# Patient Record
Sex: Female | Born: 1990 | Race: Black or African American | Hispanic: No | Marital: Single | Smoking: Never smoker
Health system: Southern US, Community
[De-identification: ages and names within clinical notes are randomized; demographics above are authoritative.]

## PROBLEM LIST (undated history)

## (undated) DIAGNOSIS — Z789 Other specified health status: Secondary | ICD-10-CM

## (undated) DIAGNOSIS — Z8619 Personal history of other infectious and parasitic diseases: Secondary | ICD-10-CM

## (undated) HISTORY — DX: Other specified health status: Z78.9

## (undated) HISTORY — DX: Personal history of other infectious and parasitic diseases: Z86.19

## (undated) HISTORY — PX: NO PAST SURGERIES: SHX2092

---
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
## 2010-09-23 NOTE — Op Note (Signed)
Tasha Murray, Tasha Murray               ACCOUNT NO.:  [id number]   MEDICAL RECORD NO.:  [id number]          PATIENT TYPE:  AMB   LOCATION:  SDS                          FACILITY:  [facility]   PHYSICIAN:  Newman Pies, MD            DATE OF BIRTH:  1991/02/22   DATE OF PROCEDURE:  DATE OF DISCHARGE:  05/09/2008                               OPERATIVE REPORT   SURGEON:  Newman Pies, MD   PREOPERATIVE DIAGNOSES:  1. Adenotonsillar hypertrophy.  2. Chronic tonsillitis/pharyngitis.   POSTOPERATIVE DIAGNOSES:  1. Adenotonsillar hypertrophy.  2. Chronic tonsillitis/pharyngitis.   PROCEDURE PERFORMED:  Adenotonsillectomy.   ANESTHESIA:  General endotracheal tube anesthesia.   COMPLICATIONS:  None.   ESTIMATED BLOOD LOSS:  Minimal.   INDICATIONS FOR PROCEDURE:  Zi Sek is a 20 year old female with a  history of chronic sore throat, chronic tonsillitis/pharyngitis.  On  examination, she was noted to have significant adenotonsillar  hypertrophy.  Based on the above findings, the decision was made for the  patient to undergo adenotonsillectomy.  The risks, benefits,  alternatives, and details of the procedure were discussed with the  patient and her mother.  Questions were invited and answered.  Informed  consent was obtained.   DESCRIPTION:  The patient was taken to the operating room and placed  supine on the operating table.  General endotracheal tube anesthesia was  administered by the anesthesiologist.  Preop IV antibiotic and Decadron  were given.  The patient was then positioned and prepped and draped in  the standard fashion for adenotonsillectomy.  A Crowe-Davis mouthgag was  inserted into the oral cavity for exposure.  Tonsils 2+ were noted  bilaterally.  No submucous cleft or bifidity was noted.  Indirect mirror  examination of the nasopharynx revealed significant adenoid hypertrophy.  The adenoid was resected with electric adenotome.  The right tonsil was  grasped with a straight  Allis clamp and retracted medially.  It was  resected free from the underlying pharyngeal constrictor muscles with  the Coblation device.  The same procedure was repeated on the left side  without exception.  Hemostasis was achieved with the Coblation device.  The surgical sites were copiously irrigated.  An OG tube was passed to  evacuate the stomach content.  The Crowe-Davis mouthgag was removed.  The care of the patient was turned over to the anesthesiologist.  The  patient was awakened from anesthesia without difficulty.  She was  extubated and transferred to the recovery room in good condition.   OPERATIVE FINDINGS:  Adenotonsillar hypertrophy.   SPECIMENS REMOVED:  Adenoids and tonsils.   FOLLOWUP CARE:  The patient will be discharged home when she is awake,  alert, and tolerating p.o.  She will be placed on Roxicet 5-10 mL p.o.  q.4-6 h. p.r.n. pain and amoxicillin 800 mg p.o. b.i.d. for 7 days.      Newman Pies, MD  Electronically Signed     ST/MEDQ  D:  05/09/2008  T:  05/09/2008  Job:  [id number]

## 2010-09-23 NOTE — Op Note (Signed)
NAMEMARYLYNN, Tasha Murray               ACCOUNT NO.:  [id number]   MEDICAL RECORD NO.:  [id number]          PATIENT TYPE:  AMB   LOCATION:  SDS                          FACILITY:  [facility]   PHYSICIAN:  Newman Pies, MD            DATE OF BIRTH:  [birth year]   DATE OF PROCEDURE:  03/09/2008  DATE OF DISCHARGE:  03/09/2008                               OPERATIVE REPORT   PREOPERATIVE DIAGNOSES:  1. Chronic cough.  2. Throat pain.  3. Possible left piriform sinus mass.   POSTOPERATIVE DIAGNOSES:  1. Chronic cough.  2. Throat pain.  3. No piriform sinus mass is noted.   PROCEDURE PERFORMED:  Diagnostic direct laryngoscopy.   SURGEON:  Newman Pies, MD   ANESTHESIA:  General endotracheal tube anesthesia.   COMPLICATIONS:  None.   ESTIMATED BLOOD LOSS:  None.   INDICATIONS FOR PROCEDURE:  The patient is a 20 year old African  American female with a history of frequent coughing spells and throat  discomfort.  On examination, she was noted to have minimal posterior  laryngeal edema.  However, a CT scan shows a possible left piriform  sinus mass.  Based on that finding, the decision was made for the  patient to undergo direct laryngoscopy under anesthesia.  The risks,  benefits, alternatives, and details of the procedure were discussed with  the patient and her mother.  Questions were invited and answered.  Informed consent was obtained.   DESCRIPTION OF PROCEDURE:  The patient was taken to the operating room  and placed supine on the operating table.  General endotracheal tube  anesthesia was administered by the anesthesiologist.  With the patient  relaxed and sedated, a Dedo laryngoscope was inserted via the oral  cavity to expose the pharynx and the larynx.  Tonsils 2+ were noted  bilaterally.  The pharyngeal mucosa was normal.  The vallecula, piriform  sinuses, and postcricoid area were all normal.  No piriform sinus mass  was noted.  Both vocal cords were symmetric and normal.  No  suspicious  laryngeal or pharyngeal lesion was noted.  That concluded procedure for  the patient.  The care of the patient was turned over to the  anesthesiologist.  The patient was awakened from anesthesia without  difficulty.  She was extubated and transferred to the recovery room in  good condition.   OPERATIVE FINDINGS:  Tonsils 2+ were noted bilaterally.  No piriform  sinus mass was noted.  The pharyngeal and laryngeal examinations were  unremarkable.   SPECIMENS REMOVED:  None.   FOLLOWUP CARE:  The patient will be discharged home once she is awake  and alert.      Newman Pies, MD  Electronically Signed     ST/MEDQ  D:  03/09/2008  T:  03/09/2008  Job:  [id number]

## 2011-02-10 LAB — CBC
HCT: 41.7
Hemoglobin: 13.9
MCHC: 33.2
MCV: 87.2
Platelets: 245
RBC: 4.78
RDW: 14.6
WBC: 9.7

## 2011-02-13 LAB — CBC
HCT: 41.2 % (ref 36.0–49.0)
Hemoglobin: 13.6 g/dL (ref 12.0–16.0)
MCHC: 33.2 g/dL (ref 31.0–37.0)
MCV: 85 fL (ref 78.0–98.0)
Platelets: 271 10*3/uL (ref 150–400)
RBC: 4.84 MIL/uL (ref 3.80–5.70)
RDW: 14.8 % (ref 11.4–15.5)
WBC: 11.1 10*3/uL (ref 4.5–13.5)

## 2011-02-13 LAB — HCG, SERUM, QUALITATIVE: Preg, Serum: NEGATIVE

## 2013-04-26 ENCOUNTER — Other Ambulatory Visit: Payer: Self-pay | Admitting: Obstetrics & Gynecology

## 2013-04-26 DIAGNOSIS — O3680X0 Pregnancy with inconclusive fetal viability, not applicable or unspecified: Secondary | ICD-10-CM

## 2013-05-09 ENCOUNTER — Other Ambulatory Visit: Payer: Self-pay | Admitting: Obstetrics & Gynecology

## 2013-05-09 ENCOUNTER — Encounter: Payer: Self-pay | Admitting: Women's Health

## 2013-05-09 ENCOUNTER — Encounter (INDEPENDENT_AMBULATORY_CARE_PROVIDER_SITE_OTHER): Payer: Self-pay

## 2013-05-09 ENCOUNTER — Ambulatory Visit (INDEPENDENT_AMBULATORY_CARE_PROVIDER_SITE_OTHER): Payer: Medicaid Other

## 2013-05-09 DIAGNOSIS — O26849 Uterine size-date discrepancy, unspecified trimester: Secondary | ICD-10-CM

## 2013-05-09 DIAGNOSIS — O3680X0 Pregnancy with inconclusive fetal viability, not applicable or unspecified: Secondary | ICD-10-CM

## 2013-05-09 NOTE — Progress Notes (Signed)
U/S-transabdominal u/s performed, single IUP with +FCA noted, FHR-158 bpm, meas c/w 16+1wks, EDD 10/23/2013, cx appears closed (3.1cm), bilateral adnexa wnl, anterior Gr 0 placenta

## 2013-05-16 ENCOUNTER — Encounter: Payer: Self-pay | Admitting: Advanced Practice Midwife

## 2013-05-16 ENCOUNTER — Ambulatory Visit (INDEPENDENT_AMBULATORY_CARE_PROVIDER_SITE_OTHER): Payer: Medicaid Other | Admitting: Advanced Practice Midwife

## 2013-05-16 ENCOUNTER — Other Ambulatory Visit: Payer: Self-pay | Admitting: Advanced Practice Midwife

## 2013-05-16 ENCOUNTER — Encounter: Payer: Medicaid Other | Admitting: Women's Health

## 2013-05-16 ENCOUNTER — Other Ambulatory Visit (HOSPITAL_COMMUNITY)
Admission: RE | Admit: 2013-05-16 | Discharge: 2013-05-16 | Disposition: A | Discharge status: Home / Self Care | Payer: Medicaid Other | Source: Ambulatory Visit | Attending: Advanced Practice Midwife | Admitting: Advanced Practice Midwife

## 2013-05-16 VITALS — BP 112/70 | Ht 60.0 in | Wt 306.0 lb

## 2013-05-16 DIAGNOSIS — Z01419 Encounter for gynecological examination (general) (routine) without abnormal findings: Secondary | ICD-10-CM

## 2013-05-16 DIAGNOSIS — Z1389 Encounter for screening for other disorder: Secondary | ICD-10-CM

## 2013-05-16 DIAGNOSIS — Z3400 Encounter for supervision of normal first pregnancy, unspecified trimester: Secondary | ICD-10-CM

## 2013-05-16 DIAGNOSIS — Z3490 Encounter for supervision of normal pregnancy, unspecified, unspecified trimester: Secondary | ICD-10-CM

## 2013-05-16 DIAGNOSIS — R87612 Low grade squamous intraepithelial lesion on cytologic smear of cervix (LGSIL): Secondary | ICD-10-CM

## 2013-05-16 DIAGNOSIS — E669 Obesity, unspecified: Secondary | ICD-10-CM

## 2013-05-16 DIAGNOSIS — O99210 Obesity complicating pregnancy, unspecified trimester: Secondary | ICD-10-CM

## 2013-05-16 DIAGNOSIS — Z113 Encounter for screening for infections with a predominantly sexual mode of transmission: Secondary | ICD-10-CM | POA: Insufficient documentation

## 2013-05-16 DIAGNOSIS — Z124 Encounter for screening for malignant neoplasm of cervix: Secondary | ICD-10-CM | POA: Insufficient documentation

## 2013-05-16 DIAGNOSIS — O98212 Gonorrhea complicating pregnancy, second trimester: Secondary | ICD-10-CM

## 2013-05-16 DIAGNOSIS — O0930 Supervision of pregnancy with insufficient antenatal care, unspecified trimester: Secondary | ICD-10-CM

## 2013-05-16 DIAGNOSIS — Z3482 Encounter for supervision of other normal pregnancy, second trimester: Secondary | ICD-10-CM

## 2013-05-16 DIAGNOSIS — O0932 Supervision of pregnancy with insufficient antenatal care, second trimester: Secondary | ICD-10-CM

## 2013-05-16 DIAGNOSIS — Z331 Pregnant state, incidental: Secondary | ICD-10-CM

## 2013-05-16 LAB — CBC
HEMATOCRIT: 39.7 % (ref 36.0–46.0)
HEMOGLOBIN: 13.1 g/dL (ref 12.0–15.0)
MCH: 27.3 pg (ref 26.0–34.0)
MCHC: 33 g/dL (ref 30.0–36.0)
MCV: 82.7 fL (ref 78.0–100.0)
Platelets: 299 10*3/uL (ref 150–400)
RBC: 4.8 MIL/uL (ref 3.87–5.11)
RDW: 18.6 % — ABNORMAL HIGH (ref 11.5–15.5)
WBC: 13.3 10*3/uL — ABNORMAL HIGH (ref 4.0–10.5)

## 2013-05-16 LAB — POCT URINALYSIS DIPSTICK
Glucose, UA: NEGATIVE
Leukocytes, UA: NEGATIVE
Nitrite, UA: NEGATIVE
RBC UA: NEGATIVE

## 2013-05-16 NOTE — Progress Notes (Signed)
  Subjective:    Tasha Murray is a G1P0 5575w1d being seen today for her first obstetrical visit.  Her obstetrical history is significant for late pnc @ 16 weeks.  Pregnancy history fully reviewed.  Patient reports no complaints.  FOund out she was pregnant a few weeks ago.Ceasar Mons.  Filed Vitals:   05/16/13 1335  BP: 112/70  Weight: 306 lb (138.801 kg)    HISTORY: OB History  Gravida Para Term Preterm AB SAB TAB Ectopic Multiple Living  1             # Outcome Date GA Lbr Len/2nd Weight Sex Delivery Anes PTL Lv  1 CUR              Past Medical History  Diagnosis Date  . Medical history non-contributory    Past Surgical History  Procedure Laterality Date  . No past surgeries     Family History  Problem Relation Age of Onset  . Heart disease Mother   . Arthritis Mother   . Asthma Mother   . Heart disease Maternal Grandmother   . Diabetes Maternal Grandmother      Exam       Pelvic Exam:    Perineum: Normal Perineum   Vulva: normal   Vagina:  normal mucosa, normal discharge, no palpable nodules   Uterus    + FHR     Cervix: normal   Adnexa: Not palpable   Urinary:  urethral meatus normal    System: Breast:  normal appearance, no masses or tenderness   Skin: normal coloration and turgor, no rashes    Neurologic: oriented, normal, normal mood   Extremities: normal strength, tone, and muscle mass   HEENT PERRLA   Mouth/Teeth mucous membranes moist, pharynx normal without lesions   Neck supple and no masses   Cardiovascular: regular rate and rhythm   Respiratory:  appears well, vitals normal, no respiratory distress, acyanotic, normal RR   Abdomen: soft, non-tender; bowel sounds normal; no masses,  no organomegaly          Assessment:    Pregnancy: G1P0 Patient Active Problem List   Diagnosis Date Noted  . Pregnant 05/16/2013  . Late prenatal care complicating pregnancy in second trimester 05/16/2013        Plan:     Initial labs drawn. Prenatal  vitamins. Problem list reviewed and updated. Genetic Screening discussed Quad Screen: requested.  Ultrasound discussed; fetal survey: requested.  Follow up in 2 weeks.  CRESENZO-DISHMAN,Rhayne Chatwin 05/16/2013

## 2013-05-17 LAB — AFP, QUAD SCREEN
AFP: 20.1 IU/mL
Curr Gest Age: 17.1 wks.days
HCG, Total: 12836 m[IU]/mL
INH: 191.7 pg/mL
Interpretation-AFP: NEGATIVE
MOM FOR HCG: 0.93
MOM FOR INH: 1.62
MoM for AFP: 0.92
Open Spina bifida: NEGATIVE
Tri 18 Scr Risk Est: NEGATIVE
uE3 Mom: 2.07
uE3 Value: 1 ng/mL

## 2013-05-17 LAB — URINALYSIS, ROUTINE W REFLEX MICROSCOPIC
BILIRUBIN URINE: NEGATIVE
GLUCOSE, UA: NEGATIVE mg/dL
Hgb urine dipstick: NEGATIVE
Ketones, ur: 15 mg/dL — AB
Nitrite: NEGATIVE
PH: 7.5 (ref 5.0–8.0)
Protein, ur: NEGATIVE mg/dL
Specific Gravity, Urine: 1.014 (ref 1.005–1.030)
Urobilinogen, UA: 1 mg/dL (ref 0.0–1.0)

## 2013-05-17 LAB — CYSTIC FIBROSIS DIAGNOSTIC STUDY

## 2013-05-17 LAB — RUBELLA SCREEN: Rubella: 6.01 Index — ABNORMAL HIGH (ref <0.90)

## 2013-05-17 LAB — VARICELLA ZOSTER ANTIBODY, IGG: Varicella IgG: >4000 Index — ABNORMAL HIGH (ref <135.00)

## 2013-05-17 LAB — DRUG SCREEN, URINE, NO CONFIRMATION
AMPHETAMINE SCRN UR: NEGATIVE
BENZODIAZEPINES.: NEGATIVE
Barbiturate Quant, Ur: NEGATIVE
Cocaine Metabolites: NEGATIVE
Creatinine,U: 165.4 mg/dL
Marijuana Metabolite: NEGATIVE
Methadone: NEGATIVE
OPIATE SCREEN, URINE: NEGATIVE
PHENCYCLIDINE (PCP): NEGATIVE
Propoxyphene: NEGATIVE

## 2013-05-17 LAB — HEPATITIS B SURFACE ANTIGEN: HEP B S AG: NEGATIVE

## 2013-05-17 LAB — URINALYSIS, MICROSCOPIC ONLY
Bacteria, UA: NONE SEEN
CRYSTALS: NONE SEEN
Casts: NONE SEEN

## 2013-05-17 LAB — ABO AND RH: Rh Type: POSITIVE

## 2013-05-17 LAB — SICKLE CELL SCREEN: Sickle Cell Screen: NEGATIVE

## 2013-05-17 LAB — RPR

## 2013-05-17 LAB — OXYCODONE SCREEN, UA, RFLX CONFIRM: Oxycodone Screen, Ur: NEGATIVE ng/mL

## 2013-05-17 LAB — HIV ANTIBODY (ROUTINE TESTING W REFLEX): HIV: NONREACTIVE

## 2013-05-18 LAB — URINE CULTURE
COLONY COUNT: NO GROWTH
Organism ID, Bacteria: NO GROWTH

## 2013-05-23 ENCOUNTER — Ambulatory Visit (INDEPENDENT_AMBULATORY_CARE_PROVIDER_SITE_OTHER): Payer: Medicaid Other | Admitting: Adult Health

## 2013-05-23 VITALS — BP 120/76 | Ht 60.0 in | Wt 306.0 lb

## 2013-05-23 DIAGNOSIS — R87612 Low grade squamous intraepithelial lesion on cytologic smear of cervix (LGSIL): Secondary | ICD-10-CM | POA: Insufficient documentation

## 2013-05-23 DIAGNOSIS — O98212 Gonorrhea complicating pregnancy, second trimester: Secondary | ICD-10-CM | POA: Insufficient documentation

## 2013-05-23 DIAGNOSIS — Z3490 Encounter for supervision of normal pregnancy, unspecified, unspecified trimester: Secondary | ICD-10-CM

## 2013-05-23 DIAGNOSIS — O98219 Gonorrhea complicating pregnancy, unspecified trimester: Secondary | ICD-10-CM

## 2013-05-23 DIAGNOSIS — A549 Gonococcal infection, unspecified: Secondary | ICD-10-CM

## 2013-05-23 DIAGNOSIS — O0932 Supervision of pregnancy with insufficient antenatal care, second trimester: Secondary | ICD-10-CM

## 2013-05-23 MED ORDER — AZITHROMYCIN 500 MG PO TABS: 1000.0000 mg | ORAL_TABLET | Freq: Once | ORAL | Status: DC

## 2013-05-23 MED ORDER — CEFTRIAXONE SODIUM 1 G IJ SOLR
250.0000 mg | Freq: Once | INTRAMUSCULAR | Status: AC
  Administered 2013-05-23: 250 mg via INTRAMUSCULAR

## 2013-05-23 NOTE — Addendum Note (Signed)
Addended by: Jacklyn ShellRESENZO-DISHMON, Any Mcneice on: 05/23/2013 10:37 AM   Modules accepted: Orders

## 2013-05-23 NOTE — Progress Notes (Signed)
Patient ID: Tasha FickKiri S Rasmus, female   DOB: 05-Jan-1991, 23 y.o.   MRN: [id number] Pt here for rocephin injection. Pt denies any problems or concerns at this time.

## 2013-05-23 NOTE — Progress Notes (Signed)
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  Pt called and given results.  Will schedule appt for Rocephin 250mg  IM and azithromycin 1gm PO sent to pharmacy.  Colpo also scheduled.,  Pt knows not to have intercourse until POC in about 3 weeks. CRESENZO-DISHMAN,Adalynne Steffensmeier 10:36 AM

## 2013-05-31 ENCOUNTER — Encounter: Payer: Self-pay | Admitting: Women's Health

## 2013-05-31 ENCOUNTER — Ambulatory Visit (INDEPENDENT_AMBULATORY_CARE_PROVIDER_SITE_OTHER): Payer: Medicaid Other

## 2013-05-31 ENCOUNTER — Other Ambulatory Visit: Payer: Self-pay | Admitting: Advanced Practice Midwife

## 2013-05-31 ENCOUNTER — Ambulatory Visit (INDEPENDENT_AMBULATORY_CARE_PROVIDER_SITE_OTHER): Payer: Medicaid Other | Admitting: Women's Health

## 2013-05-31 VITALS — BP 102/64 | Wt 307.0 lb

## 2013-05-31 DIAGNOSIS — Z1389 Encounter for screening for other disorder: Secondary | ICD-10-CM

## 2013-05-31 DIAGNOSIS — O0930 Supervision of pregnancy with insufficient antenatal care, unspecified trimester: Secondary | ICD-10-CM

## 2013-05-31 DIAGNOSIS — Z3480 Encounter for supervision of other normal pregnancy, unspecified trimester: Secondary | ICD-10-CM

## 2013-05-31 DIAGNOSIS — O98212 Gonorrhea complicating pregnancy, second trimester: Secondary | ICD-10-CM

## 2013-05-31 DIAGNOSIS — Z331 Pregnant state, incidental: Secondary | ICD-10-CM

## 2013-05-31 DIAGNOSIS — O99210 Obesity complicating pregnancy, unspecified trimester: Secondary | ICD-10-CM

## 2013-05-31 DIAGNOSIS — Z3482 Encounter for supervision of other normal pregnancy, second trimester: Secondary | ICD-10-CM

## 2013-05-31 DIAGNOSIS — Z363 Encounter for antenatal screening for malformations: Secondary | ICD-10-CM

## 2013-05-31 DIAGNOSIS — O98219 Gonorrhea complicating pregnancy, unspecified trimester: Secondary | ICD-10-CM

## 2013-05-31 DIAGNOSIS — Z3490 Encounter for supervision of normal pregnancy, unspecified, unspecified trimester: Secondary | ICD-10-CM

## 2013-05-31 DIAGNOSIS — E669 Obesity, unspecified: Secondary | ICD-10-CM

## 2013-05-31 LAB — POCT URINALYSIS DIPSTICK
Blood, UA: NEGATIVE
GLUCOSE UA: NEGATIVE
Leukocytes, UA: NEGATIVE
Nitrite, UA: NEGATIVE
Protein, UA: NEGATIVE

## 2013-05-31 NOTE — Progress Notes (Signed)
U/S(19+2wks)-active fetus, meas c/w dates, fluid wnl, anterior Gr 0 placenta, cx appears closed (3.0 cm), FHR- 151 bpm, bilateral adnexa appears wnl, female fetus, no obvious abnl noted sub-optimal views due to maternal body habitus

## 2013-05-31 NOTE — Patient Instructions (Addendum)
[place] Pediatricians:  [facility] [phone]            [facility] [phone]                 [facility] [phone] (usually doesn't accept new patients unless you have family there already, you are always welcome to call and ask)             [facility] ([address]) [phone]   [facility] Pediatricians:   [facility]: [phone]  [facility]: [phone]   Second Trimester of Pregnancy The second trimester is from week 13 through week 28, months 4 through 6. The second trimester is often a time when you feel your best. Your body has also adjusted to being pregnant, and you begin to feel better physically. Usually, morning sickness has lessened or quit completely, you may have more energy, and you may have an increase in appetite. The second trimester is also a time when the fetus is growing rapidly. At the end of the sixth month, the fetus is about 9 inches long and weighs about 1 pounds. You will likely begin to feel the baby move (quickening) between 18 and 20 weeks of the pregnancy. BODY CHANGES Your body goes through many changes during pregnancy. The changes vary from woman to woman.   Your weight will continue to increase. You will notice your lower abdomen bulging out.  You may begin to get stretch marks on your hips, abdomen, and breasts.  You may develop headaches that can be relieved by medicines approved by your caregiver.  You may urinate more often because the fetus is pressing on your bladder.  You may develop or continue to have heartburn as a result of your pregnancy.  You may develop constipation because certain hormones are causing the muscles that push waste through your intestines to slow down.  You may develop hemorrhoids or swollen, bulging veins (varicose veins).  You may have back pain because of the weight gain and pregnancy  hormones relaxing your joints between the bones in your pelvis and as a result of a shift in weight and the muscles that support your balance.  Your breasts will continue to grow and be tender.  Your gums may bleed and may be sensitive to brushing and flossing.  Dark spots or blotches (chloasma, mask of pregnancy) may develop on your face. This will likely fade after the baby is born.  A dark line from your belly button to the pubic area (linea nigra) may appear. This will likely fade after the baby is born. WHAT TO EXPECT AT YOUR PRENATAL VISITS During a routine prenatal visit:  You will be weighed to make sure you and the fetus are growing normally.  Your blood pressure will be taken.  Your abdomen will be measured to track your baby's growth.  The fetal heartbeat will be listened to.  Any test results from the previous visit will be discussed. Your caregiver may ask you:  How you are feeling.  If you are feeling the baby move.  If you have had any abnormal symptoms, such as leaking fluid, bleeding, severe headaches, or abdominal cramping.  If you have any questions. Other tests that may be performed during your second trimester include:  Blood tests that check for:  Low iron levels (anemia).  Gestational diabetes (between 24 and 28 weeks).  Rh antibodies.  Urine tests to check for infections, diabetes, or protein  in the urine.  An ultrasound to confirm the proper growth and development of the baby.  An amniocentesis to check for possible genetic problems.  Fetal screens for spina bifida and Down syndrome. HOME CARE INSTRUCTIONS   Avoid all smoking, herbs, alcohol, and unprescribed drugs. These chemicals affect the formation and growth of the baby.  Follow your caregiver's instructions regarding medicine use. There are medicines that are either safe or unsafe to take during pregnancy.  Exercise only as directed by your caregiver. Experiencing uterine cramps is a  good sign to stop exercising.  Continue to eat regular, healthy meals.  Wear a good support bra for breast tenderness.  Do not use hot tubs, steam rooms, or saunas.  Wear your seat belt at all times when driving.  Avoid raw meat, uncooked cheese, cat litter boxes, and soil used by cats. These carry germs that can cause birth defects in the baby.  Take your prenatal vitamins.  Try taking a stool softener (if your caregiver approves) if you develop constipation. Eat more high-fiber foods, such as fresh vegetables or fruit and whole grains. Drink plenty of fluids to keep your urine clear or pale yellow.  Take warm sitz baths to soothe any pain or discomfort caused by hemorrhoids. Use hemorrhoid cream if your caregiver approves.  If you develop varicose veins, wear support hose. Elevate your feet for 15 minutes, 3 4 times a day. Limit salt in your diet.  Avoid heavy lifting, wear low heel shoes, and practice good posture.  Rest with your legs elevated if you have leg cramps or low back pain.  Visit your dentist if you have not gone yet during your pregnancy. Use a soft toothbrush to brush your teeth and be gentle when you floss.  A sexual relationship may be continued unless your caregiver directs you otherwise.  Continue to go to all your prenatal visits as directed by your caregiver. SEEK MEDICAL CARE IF:   You have dizziness.  You have mild pelvic cramps, pelvic pressure, or nagging pain in the abdominal area.  You have persistent nausea, vomiting, or diarrhea.  You have a bad smelling vaginal discharge.  You have pain with urination. SEEK IMMEDIATE MEDICAL CARE IF:   You have a fever.  You are leaking fluid from your vagina.  You have spotting or bleeding from your vagina.  You have severe abdominal cramping or pain.  You have rapid weight gain or loss.  You have shortness of breath with chest pain.  You notice sudden or extreme swelling of your face, hands,  ankles, feet, or legs.  You have not felt your baby move in over an hour.  You have severe headaches that do not go away with medicine.  You have vision changes. Document Released: 04/21/2001 Document Revised: 12/28/2012 Document Reviewed: 06/28/2012 [employer] Patient Information 2014 [employer].  

## 2013-05-31 NOTE — Progress Notes (Signed)
Reports good fm. Denies uc's, lof, vb, uti s/s.  No complaints.  Reviewed today's anatomy u/s, ptl s/s.  All questions answered. F/U 1/29 as scheduled for colpo, then in 4wks for visit.  GC POC today.

## 2013-06-01 LAB — GC/CHLAMYDIA PROBE AMP
CT PROBE, AMP APTIMA: NEGATIVE
GC PROBE AMP APTIMA: NEGATIVE

## 2013-06-05 ENCOUNTER — Telehealth: Payer: Self-pay | Admitting: Obstetrics & Gynecology

## 2013-06-05 ENCOUNTER — Encounter: Payer: Self-pay | Admitting: Women's Health

## 2013-06-05 MED ORDER — PRENATAL PLUS 27-1 MG PO TABS: ORAL_TABLET | ORAL | Status: DC

## 2013-06-05 NOTE — Telephone Encounter (Signed)
Refill for prenatal plus escribed

## 2013-06-08 ENCOUNTER — Encounter (INDEPENDENT_AMBULATORY_CARE_PROVIDER_SITE_OTHER): Payer: Self-pay

## 2013-06-08 ENCOUNTER — Ambulatory Visit (INDEPENDENT_AMBULATORY_CARE_PROVIDER_SITE_OTHER): Payer: Medicaid Other | Admitting: Obstetrics & Gynecology

## 2013-06-08 ENCOUNTER — Encounter: Payer: Self-pay | Admitting: Obstetrics & Gynecology

## 2013-06-08 VITALS — BP 100/60 | Wt 303.0 lb

## 2013-06-08 DIAGNOSIS — O99210 Obesity complicating pregnancy, unspecified trimester: Secondary | ICD-10-CM

## 2013-06-08 DIAGNOSIS — O0930 Supervision of pregnancy with insufficient antenatal care, unspecified trimester: Secondary | ICD-10-CM

## 2013-06-08 DIAGNOSIS — E669 Obesity, unspecified: Secondary | ICD-10-CM

## 2013-06-08 DIAGNOSIS — Z1389 Encounter for screening for other disorder: Secondary | ICD-10-CM

## 2013-06-08 DIAGNOSIS — Z331 Pregnant state, incidental: Secondary | ICD-10-CM

## 2013-06-08 LAB — POCT URINALYSIS DIPSTICK
Blood, UA: NEGATIVE
GLUCOSE UA: NEGATIVE
Ketones, UA: NEGATIVE
Leukocytes, UA: NEGATIVE
NITRITE UA: NEGATIVE

## 2013-06-08 NOTE — Progress Notes (Signed)
Could not see cervix at ll, will try again 8 weeks post partum  BP weight and urine results all reviewed and noted. Patient reports good fetal movement, denies any bleeding and no rupture of membranes symptoms or regular contractions. Patient is without complaints. All questions were answered.

## 2013-07-07 ENCOUNTER — Ambulatory Visit (INDEPENDENT_AMBULATORY_CARE_PROVIDER_SITE_OTHER): Payer: Medicaid Other | Admitting: Obstetrics and Gynecology

## 2013-07-07 ENCOUNTER — Encounter: Payer: Self-pay | Admitting: Obstetrics and Gynecology

## 2013-07-07 VITALS — BP 120/80 | Wt 305.0 lb

## 2013-07-07 DIAGNOSIS — O9989 Other specified diseases and conditions complicating pregnancy, childbirth and the puerperium: Secondary | ICD-10-CM

## 2013-07-07 DIAGNOSIS — O3660X0 Maternal care for excessive fetal growth, unspecified trimester, not applicable or unspecified: Secondary | ICD-10-CM

## 2013-07-07 DIAGNOSIS — Z1389 Encounter for screening for other disorder: Secondary | ICD-10-CM

## 2013-07-07 DIAGNOSIS — E669 Obesity, unspecified: Secondary | ICD-10-CM

## 2013-07-07 DIAGNOSIS — O99891 Other specified diseases and conditions complicating pregnancy: Secondary | ICD-10-CM

## 2013-07-07 DIAGNOSIS — Z3490 Encounter for supervision of normal pregnancy, unspecified, unspecified trimester: Secondary | ICD-10-CM

## 2013-07-07 DIAGNOSIS — O99210 Obesity complicating pregnancy, unspecified trimester: Secondary | ICD-10-CM

## 2013-07-07 DIAGNOSIS — O0930 Supervision of pregnancy with insufficient antenatal care, unspecified trimester: Secondary | ICD-10-CM

## 2013-07-07 DIAGNOSIS — Z331 Pregnant state, incidental: Secondary | ICD-10-CM

## 2013-07-07 LAB — POCT URINALYSIS DIPSTICK
Blood, UA: NEGATIVE
Glucose, UA: NEGATIVE
Ketones, UA: NEGATIVE
LEUKOCYTES UA: NEGATIVE
Nitrite, UA: NEGATIVE

## 2013-07-07 NOTE — Patient Instructions (Signed)
Intrauterine Device Information An intrauterine device (IUD) is inserted into your uterus to prevent pregnancy. There are two types of IUDs available:   Copper IUD This type of IUD is wrapped in copper wire and is placed inside the uterus. Copper makes the uterus and fallopian tubes produce a fluid that kills sperm. The copper IUD can stay in place for 10 years.  Hormone IUD This type of IUD contains the hormone progestin (synthetic progesterone). The hormone thickens the cervical mucus and prevents sperm from entering the uterus. It also thins the uterine lining to prevent implantation of a fertilized egg. The hormone can weaken or kill the sperm that get into the uterus. One type of hormone IUD can stay in place for 5 years, and another type can stay in place for 3 years. Your health care provider will make sure you are a good candidate for a contraceptive IUD. Discuss with your health care provider the possible side effects.  ADVANTAGES OF AN INTRAUTERINE DEVICE  IUDs are highly effective, reversible, long acting, and low maintenance.   There are no estrogen-related side effects.   An IUD can be used when breastfeeding.   IUDs are not associated with weight gain.   The copper IUD works immediately after insertion.   The hormone IUD works right away if inserted within 7 days of your period starting. You will need to use a backup method of birth control for 7 days if the hormone IUD is inserted at any other time in your cycle.  The copper IUD does not interfere with your female hormones.   The hormone IUD can make heavy menstrual periods lighter and decrease cramping.   The hormone IUD can be used for 3 or 5 years.   The copper IUD can be used for 10 years. DISADVANTAGES OF AN INTRAUTERINE DEVICE  The hormone IUD can be associated with irregular bleeding patterns.   The copper IUD can make your menstrual flow heavier and more painful.   You may experience cramping and  vaginal bleeding after insertion.  Document Released: 03/31/2004 Document Revised: 12/28/2012 Document Reviewed: 10/16/2012 [facility] Patient Information 2014 [place], [place]. Estradiol injection What is this medicine? ESTRADIOL (es tra DYE ole) is an estrogen. It is used to treat the symptoms of low hormone levels in menopausal women. It is used to treat women who have had their ovaries removed or who have ovaries that do not work well. It helps to treat hot flashes and vaginal problems. It is also used to treat men with some kinds of prostate cancer. This medicine may be used for other purposes; ask your health care provider or pharmacist if you have questions. COMMON BRAND NAME(S): Delestrogen, Depo-Estradiol, Gynogen LA What should I tell my health care provider before I take this medicine? They need to know if you have or ever had any of these conditions: -abnormal vaginal bleeding -blood vessel disease or blood clots -cancer -gallbladder disease -heart disease or recent heart attack -high blood pressure -high level of calcium in the blood -hysterectomy -protein C deficiency -protein S deficiency -an unusual or allergic reaction to estrogens, other hormones, medicines, foods, dyes, or preservatives -pregnant or trying to get pregnant -breast-feeding How should I use this medicine? This medicine is for injection into a muscle. It is usually given by a health care professional in a hospital or clinic setting. A patient package insert for the product will be given with each prescription and refill. Read this sheet carefully each time. The sheet may  change frequently. Talk to your pediatrician regarding the use of this medicine in children. Special care may be needed. Overdosage: If you think you have taken too much of this medicine contact a poison control center or emergency room at once. NOTE: This medicine is only for you. Do not share this medicine with others. What if I miss a  dose? It is important not to miss your dose. Call your doctor or health care professional if you are unable to keep an appointment. What may interact with this medicine? Do not take this medicine with any of the following medications: -aromatase inhibitors like aminoglutethimide, anastrozole, exemestane, letrozole, testolactone This medicine may also interact with the following medications: -carbamazepine -certain antibiotics used to treat infections -certain barbiturates or benzodiazepines used for inducing sleep or treating seizures -grapefruit juice -medicines for fungus infections like itraconazole and ketoconazole -raloxifene or tamoxifen -rifabutin, rifampin, or rifapentine -ritonavir -St. Devany Aja's Wort -warfarin This list may not describe all possible interactions. Give your health care provider a list of all the medicines, herbs, non-prescription drugs, or dietary supplements you use. Also tell them if you smoke, drink alcohol, or use illegal drugs. Some items may interact with your medicine. What should I watch for while using this medicine? Visit your doctor or health care professional for regular checks on your progress. You will need a regular breast and pelvic exam and Pap smear while on this medicine. You should also discuss the need for regular mammograms with your health care professional, and follow his or her guidelines for these tests. This medicine can make your body retain fluid, making your fingers, hands, or ankles swell. Your blood pressure can go up. Contact your doctor or health care professional if you feel you are retaining fluid. Women should inform their doctor if they wish to become pregnant or think they might be pregnant. There is a potential for serious side effects to an unborn child. Talk to your health care professional or pharmacist for more information. Smoking increases the risk of getting a blood clot or having a stroke while you are taking this medicine,  especially if you are more than 23 years old. You are strongly advised not to smoke. If you wear contact lenses and notice visual changes, or if the lenses begin to feel uncomfortable, consult your eye doctor or health care professional. This medicine can increase the risk of developing a condition (endometrial hyperplasia) that may lead to cancer of the lining of the uterus. Taking progestins, another hormone drug, with this medicine lowers the risk of developing this condition. Therefore, if your uterus has not been removed (by a hysterectomy), your doctor may prescribe a progestin for you to take together with your estrogen. You should know, however, that taking estrogens with progestins may have additional health risks. You should discuss the use of estrogens and progestins with your health care professional to determine the benefits and risks for you. If you are going to have surgery, let your doctor know you are receiving estrogen. Consult your health care professional for advice before you schedule the surgery. What side effects may I notice from receiving this medicine? Side effects that you should report to your doctor or health care professional as soon as possible: -allergic reactions like skin rash, itching or hives, swelling of the face, lips, or tongue -breast tissue changes or discharge -changes in vision -chest pain -confusion, trouble speaking or understanding -dark urine -general ill feeling or flu-like symptoms -light-colored stools -nausea, vomiting -pain, swelling, warmth  in the leg -right upper belly pain -severe headaches -shortness of breath -sudden numbness or weakness of the face, arm or leg -trouble walking, dizziness, loss of balance or coordination -unusual vaginal bleeding -yellowing of the eyes or skin Side effects that usually do not require medical attention (report to your doctor or health care professional if they continue or are bothersome): -hair  loss -increased hunger or thirst -increased urination -symptoms of vaginal infection like itching, irritation or unusual discharge -unusually weak or tired This list may not describe all possible side effects. Call your doctor for medical advice about side effects. You may report side effects to FDA at 1-800-FDA-1088. Where should I keep my medicine? This drug is given in a hospital or clinic and will not be stored at home. NOTE: This sheet is a summary. It may not cover all possible information. If you have questions about this medicine, talk to your doctor, pharmacist, or health care provider.  2014, Elsevier/Gold Standard. (2012-08-17 13:53:26)

## 2013-07-07 NOTE — Progress Notes (Signed)
Size > Dates is attributable to the large abdominal girth. Pt's fundal height is appropriately above the umbilicus. 1547w4d. G1P0. Baby's father is not really in the picture. Has help from mother. Birthcontrol options discussed. PN2 in 4 weeks  This chart was scribed by Bennett Scrapehristina Taylor, Medical Scribe, for Dr. Christin BachJohn Margarethe Virgen on 07/07/13 at 9:34 AM. This chart was reviewed by Dr. Christin BachJohn Zeriyah Wain and is accurate.

## 2013-07-07 NOTE — Progress Notes (Signed)
Pt states that her right hand has been going numb, happens more when she sleeps. Pt denies any other problems or concerns at this time.

## 2013-08-04 ENCOUNTER — Encounter (INDEPENDENT_AMBULATORY_CARE_PROVIDER_SITE_OTHER): Payer: Self-pay

## 2013-08-04 ENCOUNTER — Other Ambulatory Visit: Payer: Medicaid Other

## 2013-08-04 ENCOUNTER — Encounter: Payer: Self-pay | Admitting: Obstetrics and Gynecology

## 2013-08-04 ENCOUNTER — Ambulatory Visit (INDEPENDENT_AMBULATORY_CARE_PROVIDER_SITE_OTHER): Payer: Medicaid Other | Admitting: Obstetrics and Gynecology

## 2013-08-04 VITALS — BP 120/80 | Wt 305.0 lb

## 2013-08-04 DIAGNOSIS — O3660X0 Maternal care for excessive fetal growth, unspecified trimester, not applicable or unspecified: Secondary | ICD-10-CM

## 2013-08-04 DIAGNOSIS — O99891 Other specified diseases and conditions complicating pregnancy: Secondary | ICD-10-CM

## 2013-08-04 DIAGNOSIS — E669 Obesity, unspecified: Secondary | ICD-10-CM

## 2013-08-04 DIAGNOSIS — O99210 Obesity complicating pregnancy, unspecified trimester: Secondary | ICD-10-CM

## 2013-08-04 DIAGNOSIS — O9989 Other specified diseases and conditions complicating pregnancy, childbirth and the puerperium: Secondary | ICD-10-CM

## 2013-08-04 DIAGNOSIS — Z3400 Encounter for supervision of normal first pregnancy, unspecified trimester: Secondary | ICD-10-CM

## 2013-08-04 DIAGNOSIS — Z1389 Encounter for screening for other disorder: Secondary | ICD-10-CM

## 2013-08-04 DIAGNOSIS — Z331 Pregnant state, incidental: Secondary | ICD-10-CM

## 2013-08-04 DIAGNOSIS — O0930 Supervision of pregnancy with insufficient antenatal care, unspecified trimester: Secondary | ICD-10-CM

## 2013-08-04 LAB — CBC
HEMATOCRIT: 37.5 % (ref 36.0–46.0)
Hemoglobin: 12.9 g/dL (ref 12.0–15.0)
MCH: 29.1 pg (ref 26.0–34.0)
MCHC: 34.4 g/dL (ref 30.0–36.0)
MCV: 84.7 fL (ref 78.0–100.0)
Platelets: 285 10*3/uL (ref 150–400)
RBC: 4.43 MIL/uL (ref 3.87–5.11)
RDW: 14.7 % (ref 11.5–15.5)
WBC: 11.2 10*3/uL — ABNORMAL HIGH (ref 4.0–10.5)

## 2013-08-04 NOTE — Progress Notes (Signed)
Pt denies any problems or concerns a this time.

## 2013-08-04 NOTE — Progress Notes (Signed)
4780w4d female presents for a routine prenatal visit and PN2. Fundal height is 36 cm, U+10.  Childbirth classes emphasized

## 2013-08-04 NOTE — Patient Instructions (Signed)
[email or web address]

## 2013-08-05 LAB — RPR

## 2013-08-05 LAB — GLUCOSE TOLERANCE, 2 HOURS W/ 1HR
GLUCOSE, 2 HOUR: 74 mg/dL (ref 70–139)
Glucose, 1 hour: 106 mg/dL (ref 70–170)
Glucose, Fasting: 68 mg/dL — ABNORMAL LOW (ref 70–99)

## 2013-08-05 LAB — ANTIBODY SCREEN: ANTIBODY SCREEN: NEGATIVE

## 2013-08-05 LAB — HIV ANTIBODY (ROUTINE TESTING W REFLEX): HIV: NONREACTIVE

## 2013-08-06 ENCOUNTER — Encounter: Payer: Self-pay | Admitting: Obstetrics and Gynecology

## 2013-08-07 LAB — HSV 2 ANTIBODY, IGG: HSV 2 Glycoprotein G Ab, IgG: 10.24 IV — ABNORMAL HIGH

## 2013-08-08 LAB — POCT URINALYSIS DIPSTICK: Glucose, UA: NEGATIVE

## 2013-08-31 ENCOUNTER — Encounter: Payer: Self-pay | Admitting: Advanced Practice Midwife

## 2013-08-31 ENCOUNTER — Ambulatory Visit (INDEPENDENT_AMBULATORY_CARE_PROVIDER_SITE_OTHER): Payer: Medicaid Other | Admitting: Advanced Practice Midwife

## 2013-08-31 VITALS — BP 102/80 | Wt 306.0 lb

## 2013-08-31 DIAGNOSIS — E669 Obesity, unspecified: Secondary | ICD-10-CM

## 2013-08-31 DIAGNOSIS — O98519 Other viral diseases complicating pregnancy, unspecified trimester: Secondary | ICD-10-CM

## 2013-08-31 DIAGNOSIS — O99891 Other specified diseases and conditions complicating pregnancy: Secondary | ICD-10-CM

## 2013-08-31 DIAGNOSIS — R7689 Other specified abnormal immunological findings in serum: Secondary | ICD-10-CM | POA: Insufficient documentation

## 2013-08-31 DIAGNOSIS — Z1389 Encounter for screening for other disorder: Secondary | ICD-10-CM

## 2013-08-31 DIAGNOSIS — O0930 Supervision of pregnancy with insufficient antenatal care, unspecified trimester: Secondary | ICD-10-CM

## 2013-08-31 DIAGNOSIS — O3660X0 Maternal care for excessive fetal growth, unspecified trimester, not applicable or unspecified: Secondary | ICD-10-CM

## 2013-08-31 DIAGNOSIS — O99210 Obesity complicating pregnancy, unspecified trimester: Secondary | ICD-10-CM

## 2013-08-31 DIAGNOSIS — Z331 Pregnant state, incidental: Secondary | ICD-10-CM

## 2013-08-31 DIAGNOSIS — O9989 Other specified diseases and conditions complicating pregnancy, childbirth and the puerperium: Secondary | ICD-10-CM

## 2013-08-31 DIAGNOSIS — R768 Other specified abnormal immunological findings in serum: Secondary | ICD-10-CM | POA: Insufficient documentation

## 2013-08-31 LAB — POCT URINALYSIS DIPSTICK
Blood, UA: NEGATIVE
Glucose, UA: NEGATIVE
Nitrite, UA: NEGATIVE
PROTEIN UA: NEGATIVE

## 2013-08-31 NOTE — Progress Notes (Signed)
Counseled about HSV.    No c/o at this time.  Size >dates, of course, but consistent growth.  Will check EFW at term. Routine questions about pregnancy answered.  F/U in 2 weeks for Low-risk ob appt

## 2013-09-14 ENCOUNTER — Encounter: Payer: Self-pay | Admitting: Advanced Practice Midwife

## 2013-09-14 ENCOUNTER — Ambulatory Visit (INDEPENDENT_AMBULATORY_CARE_PROVIDER_SITE_OTHER): Payer: Medicaid Other | Admitting: Advanced Practice Midwife

## 2013-09-14 VITALS — BP 104/70 | Wt 308.0 lb

## 2013-09-14 DIAGNOSIS — O9989 Other specified diseases and conditions complicating pregnancy, childbirth and the puerperium: Secondary | ICD-10-CM

## 2013-09-14 DIAGNOSIS — O99210 Obesity complicating pregnancy, unspecified trimester: Secondary | ICD-10-CM

## 2013-09-14 DIAGNOSIS — Z1389 Encounter for screening for other disorder: Secondary | ICD-10-CM

## 2013-09-14 DIAGNOSIS — O0930 Supervision of pregnancy with insufficient antenatal care, unspecified trimester: Secondary | ICD-10-CM

## 2013-09-14 DIAGNOSIS — E669 Obesity, unspecified: Secondary | ICD-10-CM

## 2013-09-14 DIAGNOSIS — R768 Other specified abnormal immunological findings in serum: Secondary | ICD-10-CM

## 2013-09-14 DIAGNOSIS — O98519 Other viral diseases complicating pregnancy, unspecified trimester: Secondary | ICD-10-CM

## 2013-09-14 DIAGNOSIS — O99891 Other specified diseases and conditions complicating pregnancy: Secondary | ICD-10-CM

## 2013-09-14 DIAGNOSIS — Z331 Pregnant state, incidental: Secondary | ICD-10-CM

## 2013-09-14 LAB — POCT URINALYSIS DIPSTICK
Blood, UA: NEGATIVE
Glucose, UA: NEGATIVE
KETONES UA: NEGATIVE
Leukocytes, UA: NEGATIVE
Nitrite, UA: NEGATIVE

## 2013-09-14 MED ORDER — ACYCLOVIR 400 MG PO TABS: 400.0000 mg | ORAL_TABLET | Freq: Three times a day (TID) | ORAL | Status: DC

## 2013-09-14 NOTE — Progress Notes (Signed)
No c/o at this time. Acyclovir 400mg  TID rx.  Routine questions about pregnancy answered.  F/U in 2 weeks for Low-risk ob appt .

## 2013-09-28 ENCOUNTER — Encounter: Payer: Self-pay | Admitting: Advanced Practice Midwife

## 2013-09-28 ENCOUNTER — Ambulatory Visit (INDEPENDENT_AMBULATORY_CARE_PROVIDER_SITE_OTHER): Payer: Medicaid Other | Admitting: Advanced Practice Midwife

## 2013-09-28 VITALS — BP 114/76 | Wt 313.5 lb

## 2013-09-28 DIAGNOSIS — Z331 Pregnant state, incidental: Secondary | ICD-10-CM

## 2013-09-28 DIAGNOSIS — Z3400 Encounter for supervision of normal first pregnancy, unspecified trimester: Secondary | ICD-10-CM

## 2013-09-28 DIAGNOSIS — R87612 Low grade squamous intraepithelial lesion on cytologic smear of cervix (LGSIL): Secondary | ICD-10-CM

## 2013-09-28 DIAGNOSIS — Z1389 Encounter for screening for other disorder: Secondary | ICD-10-CM

## 2013-09-28 LAB — POCT URINALYSIS DIPSTICK
Glucose, UA: NEGATIVE
Ketones, UA: NEGATIVE
NITRITE UA: NEGATIVE
Protein, UA: NEGATIVE
RBC UA: NEGATIVE

## 2013-09-28 NOTE — Progress Notes (Signed)
No c/o at this time.  Routine questions about pregnancy answered.  F/U in 1 weeks for Low-risk ob appt  And 2 weeks for EFW (already scheduled).

## 2013-09-28 NOTE — Addendum Note (Signed)
Addended by: Colen DarlingYOUNG, Tyshell Ramberg S on: 09/28/2013 12:21 PM   Modules accepted: Orders

## 2013-09-30 LAB — STREP B DNA PROBE: GBSP: NOT DETECTED

## 2013-10-04 ENCOUNTER — Other Ambulatory Visit: Payer: Self-pay | Admitting: Advanced Practice Midwife

## 2013-10-04 DIAGNOSIS — O26849 Uterine size-date discrepancy, unspecified trimester: Secondary | ICD-10-CM

## 2013-10-05 ENCOUNTER — Ambulatory Visit (INDEPENDENT_AMBULATORY_CARE_PROVIDER_SITE_OTHER): Payer: Medicaid Other

## 2013-10-05 ENCOUNTER — Ambulatory Visit (INDEPENDENT_AMBULATORY_CARE_PROVIDER_SITE_OTHER): Payer: Medicaid Other | Admitting: Obstetrics & Gynecology

## 2013-10-05 ENCOUNTER — Encounter: Payer: Self-pay | Admitting: Obstetrics & Gynecology

## 2013-10-05 VITALS — BP 120/80 | Wt 305.5 lb

## 2013-10-05 DIAGNOSIS — Z331 Pregnant state, incidental: Secondary | ICD-10-CM

## 2013-10-05 DIAGNOSIS — Z3400 Encounter for supervision of normal first pregnancy, unspecified trimester: Secondary | ICD-10-CM

## 2013-10-05 DIAGNOSIS — O26849 Uterine size-date discrepancy, unspecified trimester: Secondary | ICD-10-CM

## 2013-10-05 DIAGNOSIS — Z1389 Encounter for screening for other disorder: Secondary | ICD-10-CM

## 2013-10-05 LAB — POCT URINALYSIS DIPSTICK
GLUCOSE UA: NEGATIVE
KETONES UA: NEGATIVE
Leukocytes, UA: NEGATIVE
Nitrite, UA: NEGATIVE
Protein, UA: NEGATIVE
RBC UA: NEGATIVE

## 2013-10-05 NOTE — Progress Notes (Signed)
U/S(37+3wks)-vtx active fetus, appropriate growth EFW 6 lb 5 oz (29th%tile), fluid wnl AFI-8.7cm SDP-3.6cm, FHR-135 bpm, anterior Gr 2 placenta

## 2013-10-05 NOTE — Progress Notes (Signed)
Sonogram is reviewed and report done 29%tile growth  G1P0 [redacted]w[redacted]d Estimated Date of Delivery: 10/23/13   BP weight and urine results all reviewed and noted. Patient reports good fetal movement, denies any bleeding and no rupture of membranes symptoms or regular contractions.  Patient is without complaints. All questions were answered.  Follow up in 1 weeks for routine OB appt

## 2013-10-06 LAB — OB RESULTS CONSOLE GC/CHLAMYDIA
Chlamydia: NEGATIVE
Gonorrhea: NEGATIVE

## 2013-10-06 LAB — GC/CHLAMYDIA PROBE AMP
CT Probe RNA: NEGATIVE
GC PROBE AMP APTIMA: NEGATIVE

## 2013-10-12 ENCOUNTER — Encounter: Payer: Medicaid Other | Admitting: Advanced Practice Midwife

## 2013-10-12 ENCOUNTER — Encounter: Payer: Self-pay | Admitting: Advanced Practice Midwife

## 2013-10-12 ENCOUNTER — Ambulatory Visit (INDEPENDENT_AMBULATORY_CARE_PROVIDER_SITE_OTHER): Payer: Medicaid Other | Admitting: Advanced Practice Midwife

## 2013-10-12 VITALS — BP 124/70 | Wt 310.5 lb

## 2013-10-12 DIAGNOSIS — O99210 Obesity complicating pregnancy, unspecified trimester: Secondary | ICD-10-CM

## 2013-10-12 DIAGNOSIS — E669 Obesity, unspecified: Secondary | ICD-10-CM

## 2013-10-12 DIAGNOSIS — Z1389 Encounter for screening for other disorder: Secondary | ICD-10-CM

## 2013-10-12 DIAGNOSIS — Z331 Pregnant state, incidental: Secondary | ICD-10-CM

## 2013-10-12 DIAGNOSIS — O0990 Supervision of high risk pregnancy, unspecified, unspecified trimester: Secondary | ICD-10-CM

## 2013-10-12 DIAGNOSIS — Z6841 Body Mass Index (BMI) 40.0 and over, adult: Secondary | ICD-10-CM

## 2013-10-12 LAB — POCT URINALYSIS DIPSTICK
Blood, UA: NEGATIVE
GLUCOSE UA: NEGATIVE
Leukocytes, UA: NEGATIVE
NITRITE UA: NEGATIVE
Protein, UA: NEGATIVE

## 2013-10-12 NOTE — Progress Notes (Signed)
High Risk Pregnancy Diagnosis(es):   Morbid Obesity  G1P0 [redacted]w[redacted]d Estimated Date of Delivery: 10/23/13  Blood pressure 124/70, weight 310 lb 8 oz (140.842 kg).  Urinalysis: Negative   HPI:     BP weight and urine results all reviewed and noted. Patient reports good fetal movement, denies any bleeding and no rupture of membranes symptoms or regular contractions.  Fundal Height:  44 (EFW 29% last week) Fetal Heart rate:  140 Edema:  trace  Patient is without complaints. All questions were answered.  Assessment:  High Risk pregnancy d/t morbid obesity, normal exam  Medication(s) Plans:  none  Treatment Plan:  No change  Follow up in 1 weeks for OB appt

## 2013-10-19 ENCOUNTER — Ambulatory Visit (INDEPENDENT_AMBULATORY_CARE_PROVIDER_SITE_OTHER): Payer: Medicaid Other | Admitting: Advanced Practice Midwife

## 2013-10-19 ENCOUNTER — Encounter: Payer: Self-pay | Admitting: Advanced Practice Midwife

## 2013-10-19 VITALS — BP 110/80 | Wt 311.0 lb

## 2013-10-19 DIAGNOSIS — Z1389 Encounter for screening for other disorder: Secondary | ICD-10-CM

## 2013-10-19 DIAGNOSIS — Z331 Pregnant state, incidental: Secondary | ICD-10-CM

## 2013-10-19 DIAGNOSIS — Z3400 Encounter for supervision of normal first pregnancy, unspecified trimester: Secondary | ICD-10-CM

## 2013-10-19 LAB — POCT URINALYSIS DIPSTICK
Glucose, UA: NEGATIVE
Ketones, UA: NEGATIVE
Leukocytes, UA: NEGATIVE
Nitrite, UA: NEGATIVE
Protein, UA: NEGATIVE
RBC UA: NEGATIVE

## 2013-10-19 NOTE — Progress Notes (Signed)
G1P0 [redacted]w[redacted]d Estimated Date of Delivery: 10/23/13  Blood pressure 110/80, weight 311 lb (141.069 kg).   BP weight and urine results all reviewed and noted.  Please refer to the obstetrical flow sheet for the fundal height and fetal heart rate documentation:  Patient reports good fetal movement, denies any bleeding and no rupture of membranes symptoms or regular contractions. Patient is without complaints. All questions were answered.  Plan:  Continued routine obstetrical care,   Follow up in 1 weeks for OB appointment,

## 2013-10-26 ENCOUNTER — Ambulatory Visit (INDEPENDENT_AMBULATORY_CARE_PROVIDER_SITE_OTHER): Payer: Medicaid Other | Admitting: Advanced Practice Midwife

## 2013-10-26 ENCOUNTER — Encounter: Payer: Self-pay | Admitting: Advanced Practice Midwife

## 2013-10-26 VITALS — BP 120/80 | Wt 308.0 lb

## 2013-10-26 DIAGNOSIS — Z3400 Encounter for supervision of normal first pregnancy, unspecified trimester: Secondary | ICD-10-CM

## 2013-10-26 DIAGNOSIS — Z331 Pregnant state, incidental: Secondary | ICD-10-CM

## 2013-10-26 DIAGNOSIS — Z1389 Encounter for screening for other disorder: Secondary | ICD-10-CM

## 2013-10-26 DIAGNOSIS — O0930 Supervision of pregnancy with insufficient antenatal care, unspecified trimester: Secondary | ICD-10-CM

## 2013-10-26 DIAGNOSIS — Z3403 Encounter for supervision of normal first pregnancy, third trimester: Secondary | ICD-10-CM

## 2013-10-26 LAB — POCT URINALYSIS DIPSTICK
Blood, UA: NEGATIVE
Glucose, UA: NEGATIVE
KETONES UA: NEGATIVE
Nitrite, UA: NEGATIVE

## 2013-10-26 NOTE — Patient Instructions (Signed)
If you are still pregnant on Monday, June 22 at 7:30 am, [facility] ([address] [place], [place] to start your induction!  Eat a light meal before you come.  Tasha MustGood Luck!!

## 2013-10-26 NOTE — Progress Notes (Signed)
Pt denies any problems or concerns at this time. Pt wants to know what the next step is.

## 2013-10-26 NOTE — Progress Notes (Signed)
G1P0 7480w3d Estimated Date of Delivery: 10/23/13  Blood pressure 120/80, weight 308 lb (139.708 kg).   BP weight and urine results all reviewed and noted.  Please refer to the obstetrical flow sheet for the fundal height and fetal heart rate documentation:  Patient reports good fetal movement, denies any bleeding and no rupture of membranes symptoms or regular contractions. Patient is without complaints. All questions were answered.  Plan:  Membranes swep; IOL 6/22 at 0730  if undelivered  Follow up in 6 weeks for postpartum  Check up

## 2013-10-27 ENCOUNTER — Encounter (HOSPITAL_COMMUNITY): Payer: Self-pay | Admitting: *Deleted

## 2013-10-27 ENCOUNTER — Telehealth (HOSPITAL_COMMUNITY): Payer: Self-pay | Admitting: *Deleted

## 2013-10-27 NOTE — Telephone Encounter (Signed)
Preadmission screen  

## 2013-10-29 ENCOUNTER — Encounter (HOSPITAL_COMMUNITY): Payer: Self-pay | Admitting: *Deleted

## 2013-10-29 ENCOUNTER — Inpatient Hospital Stay (HOSPITAL_COMMUNITY): Payer: Medicaid Other | Admitting: Anesthesiology

## 2013-10-29 ENCOUNTER — Encounter (HOSPITAL_COMMUNITY)
Admission: AD | Disposition: A | Discharge status: Home / Self Care | Payer: Self-pay | Source: Ambulatory Visit | Attending: Obstetrics & Gynecology

## 2013-10-29 ENCOUNTER — Inpatient Hospital Stay (HOSPITAL_COMMUNITY)
Admission: AD | Admit: 2013-10-29 | Discharge: 2013-11-01 | DRG: 765 | Disposition: A | Discharge status: Home / Self Care | Payer: Medicaid Other | Source: Ambulatory Visit | Attending: Obstetrics & Gynecology | Admitting: Obstetrics & Gynecology

## 2013-10-29 ENCOUNTER — Encounter (HOSPITAL_COMMUNITY): Payer: Medicaid Other | Admitting: Anesthesiology

## 2013-10-29 DIAGNOSIS — Z6841 Body Mass Index (BMI) 40.0 and over, adult: Secondary | ICD-10-CM

## 2013-10-29 DIAGNOSIS — O99214 Obesity complicating childbirth: Secondary | ICD-10-CM

## 2013-10-29 DIAGNOSIS — Z3490 Encounter for supervision of normal pregnancy, unspecified, unspecified trimester: Secondary | ICD-10-CM

## 2013-10-29 DIAGNOSIS — E669 Obesity, unspecified: Secondary | ICD-10-CM

## 2013-10-29 DIAGNOSIS — Z833 Family history of diabetes mellitus: Secondary | ICD-10-CM

## 2013-10-29 LAB — CBC
HEMATOCRIT: 39.7 % (ref 36.0–46.0)
Hemoglobin: 13.8 g/dL (ref 12.0–15.0)
MCH: 29.9 pg (ref 26.0–34.0)
MCHC: 34.8 g/dL (ref 30.0–36.0)
MCV: 85.9 fL (ref 78.0–100.0)
Platelets: 265 10*3/uL (ref 150–400)
RBC: 4.62 MIL/uL (ref 3.87–5.11)
RDW: 15.3 % (ref 11.5–15.5)
WBC: 15.8 10*3/uL — ABNORMAL HIGH (ref 4.0–10.5)

## 2013-10-29 LAB — RPR

## 2013-10-29 LAB — TYPE AND SCREEN
ABO/RH(D): O POS
Antibody Screen: NEGATIVE

## 2013-10-29 LAB — ABO/RH: ABO/RH(D): O POS

## 2013-10-29 SURGERY — Surgical Case
Anesthesia: Regional

## 2013-10-29 MED ORDER — KETOROLAC TROMETHAMINE 30 MG/ML IJ SOLN: 30.0000 mg | Freq: Four times a day (QID) | INTRAMUSCULAR | Status: AC | PRN

## 2013-10-29 MED ORDER — LACTATED RINGERS IV SOLN: 500.0000 mL | INTRAVENOUS | Status: DC | PRN

## 2013-10-29 MED ORDER — METOCLOPRAMIDE HCL 5 MG/ML IJ SOLN: 10.0000 mg | Freq: Three times a day (TID) | INTRAMUSCULAR | Status: DC | PRN

## 2013-10-29 MED ORDER — LANOLIN HYDROUS EX OINT: 1.0000 "application " | TOPICAL_OINTMENT | CUTANEOUS | Status: DC | PRN

## 2013-10-29 MED ORDER — DIPHENHYDRAMINE HCL 50 MG/ML IJ SOLN: 25.0000 mg | INTRAMUSCULAR | Status: DC | PRN

## 2013-10-29 MED ORDER — ONDANSETRON HCL 4 MG PO TABS
4.0000 mg | ORAL_TABLET | ORAL | Status: DC | PRN
  Administered 2013-10-30 – 2013-10-31 (×2): 4 mg via ORAL
  Filled 2013-10-29 (×2): qty 1

## 2013-10-29 MED ORDER — LACTATED RINGERS IV SOLN
INTRAVENOUS | Status: DC
  Administered 2013-10-29 (×3): via INTRAVENOUS

## 2013-10-29 MED ORDER — SENNOSIDES-DOCUSATE SODIUM 8.6-50 MG PO TABS
2.0000 | ORAL_TABLET | ORAL | Status: DC
  Administered 2013-10-29 – 2013-11-01 (×3): 2 via ORAL
  Filled 2013-10-29 (×3): qty 2

## 2013-10-29 MED ORDER — IBUPROFEN 600 MG PO TABS
600.0000 mg | ORAL_TABLET | Freq: Four times a day (QID) | ORAL | Status: DC | PRN
Start: 2013-10-29 — End: 2013-10-29

## 2013-10-29 MED ORDER — DIPHENHYDRAMINE HCL 25 MG PO CAPS: 25.0000 mg | ORAL_CAPSULE | ORAL | Status: DC | PRN

## 2013-10-29 MED ORDER — DIBUCAINE 1 % RE OINT: 1.0000 "application " | TOPICAL_OINTMENT | RECTAL | Status: DC | PRN

## 2013-10-29 MED ORDER — PHENYLEPHRINE 40 MCG/ML (10ML) SYRINGE FOR IV PUSH (FOR BLOOD PRESSURE SUPPORT)
PREFILLED_SYRINGE | INTRAVENOUS | Status: AC
  Filled 2013-10-29: qty 5

## 2013-10-29 MED ORDER — SIMETHICONE 80 MG PO CHEW
80.0000 mg | CHEWABLE_TABLET | ORAL | Status: DC
  Administered 2013-10-29 – 2013-11-01 (×3): 80 mg via ORAL
  Filled 2013-10-29 (×3): qty 1

## 2013-10-29 MED ORDER — LIDOCAINE HCL (PF) 1 % IJ SOLN: 30.0000 mL | INTRAMUSCULAR | Status: DC | PRN

## 2013-10-29 MED ORDER — BUPIVACAINE HCL (PF) 0.5 % IJ SOLN
INTRAMUSCULAR | Status: AC
  Filled 2013-10-29: qty 30

## 2013-10-29 MED ORDER — DIPHENHYDRAMINE HCL 50 MG/ML IJ SOLN: 12.5000 mg | INTRAMUSCULAR | Status: DC | PRN

## 2013-10-29 MED ORDER — DIPHENHYDRAMINE HCL 25 MG PO CAPS: 25.0000 mg | ORAL_CAPSULE | Freq: Four times a day (QID) | ORAL | Status: DC | PRN

## 2013-10-29 MED ORDER — PHENYLEPHRINE 8 MG IN D5W 100 ML (0.08MG/ML) PREMIX OPTIME
INJECTION | INTRAVENOUS | Status: DC | PRN
  Administered 2013-10-29: 60 ug/min via INTRAVENOUS

## 2013-10-29 MED ORDER — ONDANSETRON HCL 4 MG/2ML IJ SOLN: 4.0000 mg | Freq: Three times a day (TID) | INTRAMUSCULAR | Status: DC | PRN

## 2013-10-29 MED ORDER — ACETAMINOPHEN 325 MG PO TABS: 650.0000 mg | ORAL_TABLET | ORAL | Status: DC | PRN

## 2013-10-29 MED ORDER — MEPERIDINE HCL 25 MG/ML IJ SOLN: 6.2500 mg | INTRAMUSCULAR | Status: DC | PRN

## 2013-10-29 MED ORDER — NALOXONE HCL 1 MG/ML IJ SOLN
1.0000 ug/kg/h | INTRAVENOUS | Status: DC | PRN
  Filled 2013-10-29: qty 2

## 2013-10-29 MED ORDER — PRENATAL MULTIVITAMIN CH
1.0000 | ORAL_TABLET | Freq: Every day | ORAL | Status: DC
Start: 2013-10-29 — End: 2013-11-01
  Administered 2013-10-30 – 2013-10-31 (×2): 1 via ORAL
  Filled 2013-10-29 (×2): qty 1

## 2013-10-29 MED ORDER — FENTANYL CITRATE 0.05 MG/ML IJ SOLN: 25.0000 ug | INTRAMUSCULAR | Status: DC | PRN

## 2013-10-29 MED ORDER — KETOROLAC TROMETHAMINE 30 MG/ML IJ SOLN
30.0000 mg | Freq: Four times a day (QID) | INTRAMUSCULAR | Status: AC | PRN
  Administered 2013-10-29: 30 mg via INTRAMUSCULAR

## 2013-10-29 MED ORDER — SODIUM CHLORIDE 0.9 % IV SOLN
2.0000 g | Freq: Once | INTRAVENOUS | Status: AC
  Administered 2013-10-29: 2 g via INTRAVENOUS
  Filled 2013-10-29: qty 2000

## 2013-10-29 MED ORDER — OXYTOCIN 10 UNIT/ML IJ SOLN
INTRAMUSCULAR | Status: AC
  Filled 2013-10-29: qty 4

## 2013-10-29 MED ORDER — CITRIC ACID-SODIUM CITRATE 334-500 MG/5ML PO SOLN
30.0000 mL | ORAL | Status: DC | PRN
Start: 2013-10-29 — End: 2013-10-29

## 2013-10-29 MED ORDER — SCOPOLAMINE 1 MG/3DAYS TD PT72
MEDICATED_PATCH | TRANSDERMAL | Status: AC
  Filled 2013-10-29: qty 1

## 2013-10-29 MED ORDER — LACTATED RINGERS IV SOLN: INTRAVENOUS | Status: DC

## 2013-10-29 MED ORDER — CITRIC ACID-SODIUM CITRATE 334-500 MG/5ML PO SOLN
ORAL | Status: AC
  Administered 2013-10-29: 09:00:00
  Filled 2013-10-29: qty 15

## 2013-10-29 MED ORDER — OXYTOCIN 10 UNIT/ML IJ SOLN
40.0000 [IU] | INTRAVENOUS | Status: DC | PRN
  Administered 2013-10-29: 40 [IU] via INTRAVENOUS

## 2013-10-29 MED ORDER — IBUPROFEN 600 MG PO TABS
600.0000 mg | ORAL_TABLET | Freq: Four times a day (QID) | ORAL | Status: DC
  Administered 2013-10-29 – 2013-11-01 (×10): 600 mg via ORAL
  Filled 2013-10-29 (×10): qty 1

## 2013-10-29 MED ORDER — MORPHINE SULFATE (PF) 0.5 MG/ML IJ SOLN
INTRAMUSCULAR | Status: DC | PRN
Start: 2013-10-29 — End: 2013-10-29
  Administered 2013-10-29: 4 mg via EPIDURAL

## 2013-10-29 MED ORDER — ONDANSETRON HCL 4 MG/2ML IJ SOLN
INTRAMUSCULAR | Status: DC | PRN
  Administered 2013-10-29: 4 mg via INTRAVENOUS

## 2013-10-29 MED ORDER — ONDANSETRON HCL 4 MG/2ML IJ SOLN: 4.0000 mg | Freq: Four times a day (QID) | INTRAMUSCULAR | Status: DC | PRN

## 2013-10-29 MED ORDER — WITCH HAZEL-GLYCERIN EX PADS: 1.0000 "application " | MEDICATED_PAD | CUTANEOUS | Status: DC | PRN

## 2013-10-29 MED ORDER — DEXTROSE 5 % IV SOLN
3.0000 g | INTRAVENOUS | Status: DC | PRN
  Administered 2013-10-29: 3 g via INTRAVENOUS

## 2013-10-29 MED ORDER — OXYTOCIN BOLUS FROM INFUSION: 500.0000 mL | INTRAVENOUS | Status: DC

## 2013-10-29 MED ORDER — MENTHOL 3 MG MT LOZG: 1.0000 | LOZENGE | OROMUCOSAL | Status: DC | PRN

## 2013-10-29 MED ORDER — PHENYLEPHRINE 8 MG IN D5W 100 ML (0.08MG/ML) PREMIX OPTIME
INJECTION | INTRAVENOUS | Status: AC
  Filled 2013-10-29: qty 100

## 2013-10-29 MED ORDER — MORPHINE SULFATE 0.5 MG/ML IJ SOLN
INTRAMUSCULAR | Status: AC
  Filled 2013-10-29: qty 10

## 2013-10-29 MED ORDER — NALBUPHINE HCL 10 MG/ML IJ SOLN: 5.0000 mg | INTRAMUSCULAR | Status: DC | PRN

## 2013-10-29 MED ORDER — SCOPOLAMINE 1 MG/3DAYS TD PT72
1.0000 | MEDICATED_PATCH | Freq: Once | TRANSDERMAL | Status: AC
  Administered 2013-10-29: 1.5 mg via TRANSDERMAL

## 2013-10-29 MED ORDER — OXYCODONE-ACETAMINOPHEN 5-325 MG PO TABS
1.0000 | ORAL_TABLET | ORAL | Status: DC | PRN
  Administered 2013-10-31: 1 via ORAL
  Filled 2013-10-29: qty 1

## 2013-10-29 MED ORDER — OXYCODONE-ACETAMINOPHEN 5-325 MG PO TABS: 1.0000 | ORAL_TABLET | ORAL | Status: DC | PRN

## 2013-10-29 MED ORDER — SODIUM CHLORIDE 0.9 % IJ SOLN: 3.0000 mL | INTRAMUSCULAR | Status: DC | PRN

## 2013-10-29 MED ORDER — ZOLPIDEM TARTRATE 5 MG PO TABS: 5.0000 mg | ORAL_TABLET | Freq: Every evening | ORAL | Status: DC | PRN

## 2013-10-29 MED ORDER — SIMETHICONE 80 MG PO CHEW
80.0000 mg | CHEWABLE_TABLET | ORAL | Status: DC | PRN
  Filled 2013-10-29: qty 1

## 2013-10-29 MED ORDER — FLEET ENEMA 7-19 GM/118ML RE ENEM
1.0000 | ENEMA | RECTAL | Status: DC | PRN
Start: 2013-10-29 — End: 2013-10-29

## 2013-10-29 MED ORDER — ONDANSETRON HCL 4 MG/2ML IJ SOLN: 4.0000 mg | INTRAMUSCULAR | Status: DC | PRN

## 2013-10-29 MED ORDER — NALOXONE HCL 0.4 MG/ML IJ SOLN: 0.4000 mg | INTRAMUSCULAR | Status: DC | PRN

## 2013-10-29 MED ORDER — FENTANYL CITRATE 0.05 MG/ML IJ SOLN
INTRAMUSCULAR | Status: AC
  Filled 2013-10-29: qty 2

## 2013-10-29 MED ORDER — KETOROLAC TROMETHAMINE 30 MG/ML IJ SOLN
INTRAMUSCULAR | Status: AC
  Administered 2013-10-29: 30 mg via INTRAMUSCULAR
  Filled 2013-10-29: qty 1

## 2013-10-29 MED ORDER — SIMETHICONE 80 MG PO CHEW
80.0000 mg | CHEWABLE_TABLET | Freq: Three times a day (TID) | ORAL | Status: DC
  Administered 2013-10-29 – 2013-11-01 (×8): 80 mg via ORAL
  Filled 2013-10-29 (×8): qty 1

## 2013-10-29 MED ORDER — OXYTOCIN 40 UNITS IN LACTATED RINGERS INFUSION - SIMPLE MED: 62.5000 mL/h | INTRAVENOUS | Status: DC

## 2013-10-29 MED ORDER — LACTATED RINGERS IV SOLN
INTRAVENOUS | Status: DC
  Administered 2013-10-29: 23:00:00 via INTRAVENOUS

## 2013-10-29 MED ORDER — OXYTOCIN 40 UNITS IN LACTATED RINGERS INFUSION - SIMPLE MED: 62.5000 mL/h | INTRAVENOUS | Status: AC

## 2013-10-29 MED ORDER — ONDANSETRON HCL 4 MG/2ML IJ SOLN
INTRAMUSCULAR | Status: AC
  Filled 2013-10-29: qty 2

## 2013-10-29 MED ORDER — TETANUS-DIPHTH-ACELL PERTUSSIS 5-2.5-18.5 LF-MCG/0.5 IM SUSP
0.5000 mL | Freq: Once | INTRAMUSCULAR | Status: AC
  Administered 2013-10-31: 0.5 mL via INTRAMUSCULAR
  Filled 2013-10-29: qty 0.5

## 2013-10-29 MED ORDER — LACTATED RINGERS IV SOLN
INTRAVENOUS | Status: DC | PRN
  Administered 2013-10-29: 09:00:00 via INTRAVENOUS

## 2013-10-29 SURGICAL SUPPLY — 40 items
APL SKNCLS STERI-STRIP NONHPOA (GAUZE/BANDAGES/DRESSINGS) ×1
BARRIER ADHS 3X4 INTERCEED (GAUZE/BANDAGES/DRESSINGS) IMPLANT
BENZOIN TINCTURE PRP APPL 2/3 (GAUZE/BANDAGES/DRESSINGS) ×2 IMPLANT
BRR ADH 4X3 ABS CNTRL BYND (GAUZE/BANDAGES/DRESSINGS)
CLAMP CORD UMBIL (MISCELLANEOUS) IMPLANT
CLOSURE STERI STRIP 1/2 X4 (GAUZE/BANDAGES/DRESSINGS) ×2 IMPLANT
CLOTH BEACON ORANGE TIMEOUT ST (SAFETY) ×3 IMPLANT
CONTAINER PREFILL 10% NBF 15ML (MISCELLANEOUS) IMPLANT
DRAPE LG THREE QUARTER DISP (DRAPES) IMPLANT
DRSG OPSITE POSTOP 4X10 (GAUZE/BANDAGES/DRESSINGS) ×3 IMPLANT
DURAPREP 26ML APPLICATOR (WOUND CARE) ×3 IMPLANT
ELECT REM PT RETURN 9FT ADLT (ELECTROSURGICAL) ×3
ELECTRODE REM PT RTRN 9FT ADLT (ELECTROSURGICAL) ×1 IMPLANT
EXTRACTOR VACUUM KIWI (MISCELLANEOUS) IMPLANT
GLOVE BIO SURGEON STRL SZ 6.5 (GLOVE) ×2 IMPLANT
GLOVE BIO SURGEONS STRL SZ 6.5 (GLOVE) ×1
GOWN STRL REUS W/TWL LRG LVL3 (GOWN DISPOSABLE) ×6 IMPLANT
KIT ABG SYR 3ML LUER SLIP (SYRINGE) IMPLANT
NDL HYPO 25X5/8 SAFETYGLIDE (NEEDLE) IMPLANT
NDL SPNL 18GX3.5 QUINCKE PK (NEEDLE) ×1 IMPLANT
NEEDLE HYPO 25X5/8 SAFETYGLIDE (NEEDLE) IMPLANT
NEEDLE SPNL 18GX3.5 QUINCKE PK (NEEDLE) ×3 IMPLANT
NS IRRIG 1000ML POUR BTL (IV SOLUTION) ×3 IMPLANT
PACK C SECTION WH (CUSTOM PROCEDURE TRAY) ×3 IMPLANT
PAD OB MATERNITY 4.3X12.25 (PERSONAL CARE ITEMS) ×3 IMPLANT
SUT PDS AB 0 CTX 60 (SUTURE) IMPLANT
SUT VIC AB 0 CT1 27 (SUTURE) ×3
SUT VIC AB 0 CT1 27XBRD ANBCTR (SUTURE) IMPLANT
SUT VIC AB 0 CT1 36 (SUTURE) IMPLANT
SUT VIC AB 2-0 CT1 27 (SUTURE) ×3
SUT VIC AB 2-0 CT1 TAPERPNT 27 (SUTURE) ×1 IMPLANT
SUT VIC AB 2-0 CTX 36 (SUTURE) ×8 IMPLANT
SUT VIC AB 3-0 CT1 27 (SUTURE) ×3
SUT VIC AB 3-0 CT1 TAPERPNT 27 (SUTURE) ×1 IMPLANT
SUT VIC AB 3-0 SH 27 (SUTURE)
SUT VIC AB 3-0 SH 27X BRD (SUTURE) IMPLANT
SYR 30ML LL (SYRINGE) ×3 IMPLANT
TOWEL OR 17X24 6PK STRL BLUE (TOWEL DISPOSABLE) ×3 IMPLANT
TRAY FOLEY CATH 14FR (SET/KITS/TRAYS/PACK) ×3 IMPLANT
WATER STERILE IRR 1000ML POUR (IV SOLUTION) ×3 IMPLANT

## 2013-10-29 NOTE — Anesthesia Postprocedure Evaluation (Signed)
  Anesthesia Post-op Note  Patient: Tasha Murray  Procedure(s) Performed: Procedure(s): CESAREAN SECTION-Low transverse with vertical T (N/A)  Patient is awake, responsive, moving her legs, and has signs of resolution of her numbness. Pain and nausea are reasonably well controlled. Vital signs are stable and clinically acceptable. Oxygen saturation is clinically acceptable. There are no apparent anesthetic complications at this time. Patient is ready for discharge.

## 2013-10-29 NOTE — Addendum Note (Signed)
Addendum created 10/29/13 1512 by Renford DillsJanet L Mullins, CRNA   Modules edited: Notes Section   Notes Section:  File: [id number]

## 2013-10-29 NOTE — H&P (Signed)
Tasha Murray is a 23 y.o. female presenting for labor. Maternal Medical History:  Reason for admission: Contractions.   Contractions: Onset was 6-12 hours ago.   Frequency: regular.   Perceived severity is moderate.    Fetal activity: Perceived fetal activity is normal.   Last perceived fetal movement was within the past 12 hours.      OB History   Grav Para Term Preterm Abortions TAB SAB Ect Mult Living   1              Past Medical History  Diagnosis Date  . Medical history non-contributory   . History of gonorrhea    Past Surgical History  Procedure Laterality Date  . No past surgeries     Family History: family history includes Arthritis in her mother; Asthma in her mother; Diabetes in her maternal grandmother; Heart disease in her maternal grandmother and mother. Social History:  reports that she has never smoked. She has never used smokeless tobacco. She reports that she does not drink alcohol or use illicit drugs.   Prenatal Transfer Tool  Maternal Diabetes: No Genetic Screening: Normal Maternal Ultrasounds/Referrals: Normal Fetal Ultrasounds or other Referrals:  None Maternal Substance Abuse:  No Significant Maternal Medications:  None Significant Maternal Lab Results:  None Other Comments:  decels on admit to unit. O2, iv bolus, Dr. Marice Potterove in pt assessed. strip reviewed. decision for c/s made. risk and benifits to pt.  Review of Systems  Constitutional: Negative.   HENT: Negative.   Eyes: Negative.   Respiratory: Negative.   Cardiovascular: Negative.   Gastrointestinal: Positive for abdominal pain.  Genitourinary: Negative.   Musculoskeletal: Negative.   Skin: Negative.   Endo/Heme/Allergies: Negative.   Psychiatric/Behavioral: Negative.       There were no vitals taken for this visit. Maternal Exam:  Uterine Assessment: Contraction strength is moderate.  Contraction frequency is regular.   Abdomen: Patient reports no abdominal tenderness. Fetal  presentation: vertex  Introitus: Normal vulva. Normal vagina.    Physical Exam  Constitutional: She is oriented to person, place, and time. She appears well-developed and well-nourished.  HENT:  Head: Normocephalic.  Cardiovascular: Normal rate, regular rhythm, normal heart sounds and intact distal pulses.   Respiratory: Effort normal and breath sounds normal.  GI: Soft. Bowel sounds are normal.  Genitourinary: Vagina normal and uterus normal.  Musculoskeletal: Normal range of motion.  Neurological: She is alert and oriented to person, place, and time. She has normal reflexes.  Skin: Skin is warm and dry.  Psychiatric: She has a normal mood and affect. Her behavior is normal. Judgment and thought content normal.    Prenatal labs: ABO, Rh: O/POS/-- (01/06 1409) Antibody: NEG (03/27 0911) Rubella: 6.01 (01/06 1409) RPR: NON REAC (03/27 0911)  HBsAg: NEGATIVE (01/06 1409)  HIV: NON REACTIVE (03/27 0911)  GBS: NOT DETECTED (05/21 1213)   Assessment/Plan: NRFHR, to have primary c/s.   Wyvonnia DuskyLAWSON, Tasha DARLENE 10/29/2013, 8:46 AM

## 2013-10-29 NOTE — Anesthesia Postprocedure Evaluation (Signed)
  Anesthesia Post-op Note  Patient: Tasha Murray  Procedure(s) Performed: Procedure(s): CESAREAN SECTION-Low transverse with vertical T (N/A)  Patient Location: Mother/Baby  Anesthesia Type:Spinal and Epidural  Level of Consciousness: awake  Airway and Oxygen Therapy: Patient Spontanous Breathing  Post-op Pain: mild  Post-op Assessment: Patient's Cardiovascular Status Stable and Respiratory Function Stable  Post-op Vital Signs: stable  Last Vitals:  Filed Vitals:   10/29/13 1318  BP: 118/81  Pulse: 60  Temp: 36.4 C  Resp: 20    Complications: No apparent anesthesia complications

## 2013-10-29 NOTE — Progress Notes (Signed)
Maggie, CRNA and Dana, CRNA reAnnabelle Harmancovered patient in OR until PACU RN arrived.  OR Nurse forgot to call PACU RN.  PACU RN was called by Seward GraterMaggie, CRNA when case was finished in OR.  PACU start time 1005.

## 2013-10-29 NOTE — Op Note (Signed)
10/29/2013  9:42 AM  PATIENT:  Tasha Murray  23 y.o. female  PRE-OPERATIVE DIAGNOSIS:  3840w6days, labor, non-reassuring FHR remote from delivery, morbid obesity  POST-OPERATIVE DIAGNOSIS:  Same + thick meconium  PROCEDURE:  Procedure(s): CESAREAN SECTION-Low transverse with vertical T (N/A)  SURGEON:  Surgeon(s) and Role:    * Allie BossierMyra C Latoi Giraldo, MD - Primary  FINDINGS: Living female infant with Apgars of 2,5,and 9, intact placenta that looked to be older than its stated age, VERY thick meconium   ASSISTANTS: none   ANESTHESIA:   epidural and spinal  EBL:  Total I/O In: 2000 [I.V.:2000] Out: -   BLOOD ADMINISTERED:none  DRAINS: none   LOCAL MEDICATIONS USED:  NONE  SPECIMEN:  Source of Specimen:  cord blood gas and placenta  DISPOSITION OF SPECIMEN:  PATHOLOGY  COUNTS:  YES  TOURNIQUET:  * No tourniquets in log *  DICTATION: .Dragon Dictation  PLAN OF CARE: Admit to inpatient   PATIENT DISPOSITION:  PACU - hemodynamically stable.   Delay start of Pharmacological VTE agent (>24hrs) due to surgical blood loss or risk of bleeding: no The risks, benefits, and alternatives of surgery were explained, understood, accepted. Consents were signed. All questions were answered. Prior to going to the OR, I placed a scalp electrode to allow for better fetal monitoring. Her cervix was dilated to 3 cm (same as her last exam) and was 99% effaced. The baby was at the +1/+2 station with molding felt.  An epidural/spinal was placed for surgery in the OR. Her abdomen was prepped and draped with betadine in a sterile fashion. A Foley catheter had been placed, draining clear urine throughout case. Timeout procedure was done. After adequate anesthesia was assured, an incision was made about 2 cm above the symphysis pubis. She had been given 3 gram of IV ancef preoperatively. The incision was carried down through the subcutaneous tissue to the fascia. The fascia was scored the midline and extended  bilaterally. The middle 20% of the rectus muscles were separated in a transverse fashion using electrosurgical technique. Excellent hemostasis was maintained. The peritoneum was entered with hemostats. Peritoneal incision was extended bilaterally with traction. The bladder blade was placed. A transverse incision was made on the well-developed lower uterine segment. The uterine incision was extended with traction on each side.  Extremely thick meconium was noted with amniotomy. The baby's arms were coming out of the hysterostomy and I asked to have someone push upwards on the vertex. However, due to the patient's morbid obesity, this process was proving to be very timely (Even moving her large legs apart proved to be very challenging. I therefore made a 4 cm vertical T incision and delivered the baby in a breech fashion.The baby's cord was clamped and cut, and he was transferred to the NICU personnel for care. The placenta was delivered intact with traction. The uterus was exteriorized and the interior was cleaned with a dry lap sponge. The vertical portion of the uterine incision was closed with 2-0 Vicryl running locking suture in two layers, the second layer imbricating the first. The transverse portion of the incision was very thin myometrium and I closed this with one layer. Excellent hemostasis was noted. By tilting the uterus each side was able to visualize the adnexa, and they were normal. The rectus fascia rectus muscles were noted be hemostatic as well. The fascia was closed with a 0 Vicryl suture in a running nonlocking fashion. No defects were palpable. The subcutaneous tissue was irrigated,  clean, and dried. A subcuticular closure was done with a 3-0 Vicryl suture. Steri-Strips are placed. Excellent cosmetic results were obtained. She was taken to the recovery room in stable condition. She tolerated the procedure well.

## 2013-10-29 NOTE — Anesthesia Procedure Notes (Signed)
Spinal  Patient location during procedure: OR Preanesthetic Checklist Completed: patient identified, site marked, surgical consent, pre-op evaluation, timeout performed, IV checked, risks and benefits discussed and monitors and equipment checked Spinal Block Patient position: sitting Prep: DuraPrep Patient monitoring: cardiac monitor, continuous pulse ox, blood pressure and heart rate Approach: midline Location: L3-4 Injection technique: catheter Needle Needle type: Tuohy and Sprotte  Needle gauge: 24 G Needle length: 12.7 cm Needle insertion depth: 8 cm Catheter type: closed end flexible Catheter size: 19 g Catheter at skin depth: 15 cm Assessment Sensory level: T2 Additional Notes Spinal Dosage in OR  Bupivacaine ml       1.1  (-) asp Heme/CSF on catheter

## 2013-10-29 NOTE — Transfer of Care (Signed)
Immediate Anesthesia Transfer of Care Note  Patient: Tasha FickKiri S Murray  Procedure(s) Performed: Procedure(s): CESAREAN SECTION-Low transverse with vertical T (N/A)  Patient Location: PACU  Anesthesia Type:Spinal  Level of Consciousness: awake and alert   Airway & Oxygen Therapy: Patient Spontanous Breathing  Post-op Assessment: Report given to PACU RN and Post -op Vital signs reviewed and stable  Post vital signs: Reviewed and stable  Complications: No apparent anesthesia complications

## 2013-10-29 NOTE — Anesthesia Preprocedure Evaluation (Addendum)
Anesthesia Evaluation  Patient identified by MRN, date of birth, ID band Patient awake    Reviewed: Allergy & Precautions, H&P , Patient's Chart, lab work & pertinent test results  Airway Mallampati: II TM Distance: >3 FB Neck ROM: full    Dental no notable dental hx.    Pulmonary  breath sounds clear to auscultation  Pulmonary exam normal       Cardiovascular Exercise Tolerance: Good Rhythm:regular Rate:Normal     Neuro/Psych    GI/Hepatic   Endo/Other  Morbid obesity  Renal/GU      Musculoskeletal   Abdominal   Peds  Hematology   Anesthesia Other Findings Morbid obesity, airway looks approacable if STAT, Mother says patient had T&A done 7 years ago with similar body habitus. No labs available, otherwise no medical problems. Requested scalp monitor per Dr Marice Potterove. Plan Regional if FHR okay in OR.  Reproductive/Obstetrics                           Anesthesia Physical Anesthesia Plan  ASA: III and emergent  Anesthesia Plan: Spinal   Post-op Pain Management:    Induction:   Airway Management Planned:   Additional Equipment:   Intra-op Plan:   Post-operative Plan:   Informed Consent: I have reviewed the patients History and Physical, chart, labs and discussed the procedure including the risks, benefits and alternatives for the proposed anesthesia with the patient or authorized representative who has indicated his/her understanding and acceptance.   Dental Advisory Given  Plan Discussed with: CRNA  Anesthesia Plan Comments: (Lab work confirmed with CRNA in room. Platelets okay. Discussed spinal anesthetic, and patient consents to the procedure:  included risk of possible headache,backache, failed block, allergic reaction, and nerve injury. This patient was asked if she had any questions or concerns before the procedure started. )        Anesthesia Quick Evaluation

## 2013-10-30 ENCOUNTER — Encounter (HOSPITAL_COMMUNITY): Payer: Self-pay | Admitting: Obstetrics & Gynecology

## 2013-10-30 ENCOUNTER — Inpatient Hospital Stay (HOSPITAL_COMMUNITY): Admission: RE | Admit: 2013-10-30 | Payer: Medicaid Other | Source: Ambulatory Visit

## 2013-10-30 LAB — CBC
HCT: 31.1 % — ABNORMAL LOW (ref 36.0–46.0)
Hemoglobin: 10.5 g/dL — ABNORMAL LOW (ref 12.0–15.0)
MCH: 29.3 pg (ref 26.0–34.0)
MCHC: 33.8 g/dL (ref 30.0–36.0)
MCV: 86.9 fL (ref 78.0–100.0)
Platelets: 256 10*3/uL (ref 150–400)
RBC: 3.58 MIL/uL — AB (ref 3.87–5.11)
RDW: 15.5 % (ref 11.5–15.5)
WBC: 14.6 10*3/uL — AB (ref 4.0–10.5)

## 2013-10-30 NOTE — Progress Notes (Addendum)
Post Partum Day #1 s/p LTCS w/ vertical T incision for NRFHT  Subjective: no complaints, up ad lib, voiding and tolerating PO Pain controlled, all questions answered.   Objective: Blood pressure 104/67, pulse 109, temperature 98.9 F (37.2 C), temperature source Oral, resp. rate 20, height 4\' 9"  (1.448 m), weight 139.708 kg (308 lb), SpO2 98.00%, unknown if currently breastfeeding.  Physical Exam:  General: alert, cooperative and no distress Lochia: appropriate Uterine Fundus: firm Incision: healing well, no significant drainage, no dehiscence, no significant erythema DVT Evaluation: No evidence of DVT seen on physical exam. Negative Homan's sign.   Recent Labs  10/29/13 0853 10/30/13 0610  HGB 13.8 10.5*  HCT 39.7 31.1*    Assessment/Plan: Tentative plan for discharge tomorrow, Breastfeeding and Contraception plan for Depo Provera   LOS: 1 day   Sunnie Nielsenlexander, Natalie 10/30/2013, 7:50 AM   Seen also by me Agree with note Aviva SignsMarie L Williams, CNM

## 2013-10-30 NOTE — Progress Notes (Signed)
Ur chart review completed.  

## 2013-10-30 NOTE — Lactation Note (Signed)
This note was copied from the chart of Tasha Colonel BaldKiri Fleisher. Lactation Consultation Note New mom breast and formula feeding. Difficulty latching, baby very stuffy and snorting. Will not sustain latch to breast. Has very high palate, under bite, possible limited movement of tongue. Has low muscle tone of lips, When mom was giving bottle, noted drizzle out of corner of mouth. With low birth weight, explained the importance of feeding and keeping the baby stimulated to eat. Mom is Breast and formula, explained to put baby to breast first to feed then supplement w/formula. Gave hand pump. Mom has pendulum breast, nipples actually point down under breast, encouraged to elevate w/wash cloth, Hand pump given to stimulate and thin the nipple tissue, also "prime the Line" of colostrum. Hand expression taught. Note good colostrum. Noted and explained why could get more colostrum by hand expression than pumping. 2 ml colostrum hand expressed and given to baby via foley cup. Mom C-section and large body mass, nipples compress inwards to make nipple shorter and baby not sustaining latch, decided to give #20 NS, also gave #24. Baby latched well and nursed for 5 min. Encouraged mom to but her finger nails. Mom young and appears slightly impatient. Newborn behavior explained. Mom encouraged to feed baby 8-12 times/24 hours and with feeding cues. Mom encouraged to feed baby w/feeding cuesSpecifics of an asymmetric latch shown. Reviewed Baby & Me book's Breastfeeding Basics. [facility] brochure given w/resources, support groups and LC services.Encouraged to call for assistance if needed and to verify proper latch.Mom taught how to apply & clean nipple shield. Referred to Baby and Me Book in Breastfeeding section Pg. 22-23 for position options and Proper latch demonstration.Paced bottle-feeding taught.  Patient Name: Tasha Murray Today's Date: 10/30/2013 Reason for consult: Initial assessment   Maternal Data Infant to breast  within first hour of birth: No Breastfeeding delayed due to:: Other (comment) (wouldn't latch) Has patient been taught Hand Expression?: Yes Does the patient have breastfeeding experience prior to this delivery?: No  Feeding Feeding Type: Breast Fed  LATCH Score/Interventions Latch: Repeated attempts needed to sustain latch, nipple held in mouth throughout feeding, stimulation needed to elicit sucking reflex. Intervention(s): Adjust position;Assist with latch;Breast massage;Breast compression  Audible Swallowing: A few with stimulation Intervention(s): Skin to skin;Hand expression;Alternate breast massage  Type of Nipple: Everted at rest and after stimulation (compresses flat)  Comfort (Breast/Nipple): Soft / non-tender     Hold (Positioning): Full assist, staff holds infant at breast Intervention(s): Breastfeeding basics reviewed;Support Pillows;Position options;Skin to skin  LATCH Score: 6  Lactation Tools Discussed/Used Tools: Nipple Shields;Pump;Feeding cup;Bottle Nipple shield size: 20;24 Breast pump type: Manual Pump Review: Milk Storage;Setup, frequency, and cleaning Initiated by:: Elbert EwingsL. carver RN Date initiated:: 10/30/13   Consult Status Consult Status: Follow-up Date: 10/30/13 Follow-up type: In-patient    Charyl DancerCARVER, LAURA G 10/30/2013, 4:46 AM

## 2013-10-31 NOTE — Progress Notes (Signed)
Postop Day#2 s/p primary csection for non-reassuring FHR  Subjective: up ad lib, voiding, tolerating PO and + flatus.  Pt desires discharge home tomorrow.  Depo for birth control.  Breast and bottle feeding.  Objective: Blood pressure 142/90, pulse 77, temperature 98.8 F (37.1 C), temperature source Oral, resp. rate 18, height 4\' 9"  (1.448 m), weight 139.708 kg (308 lb), SpO2 98.00%, unknown if currently breastfeeding.  Physical Exam:  General: alert, cooperative and appears stated age CVS:  RRR, without murmur, gallops, or rubs Lungs:  CTA bilat ABD:  +BSx4, normal Lochia: appropriate Uterine Fundus: firm Incision: honeycomb dressing with drainage that appears old and outlined DVT Evaluation: No evidence of DVT seen on physical exam. Negative Homan's sign.  Recent Labs  10/29/13 0853 10/30/13 0610  HGB 13.8 10.5*  HCT 39.7 31.1*    Assessment/Plan:  Normal Postop Exam  Plan for discharge tomorrow   LOS: 2 days   [facility] LL[facility]UHAMMAD,WALIDAH 10/31/2013, 7:27 AM

## 2013-11-01 ENCOUNTER — Inpatient Hospital Stay (HOSPITAL_COMMUNITY): Payer: Medicaid Other

## 2013-11-01 MED ORDER — OXYCODONE-ACETAMINOPHEN 5-325 MG PO TABS: 1.0000 | ORAL_TABLET | ORAL | Status: DC | PRN

## 2013-11-01 MED ORDER — IBUPROFEN 600 MG PO TABS: 600.0000 mg | ORAL_TABLET | Freq: Four times a day (QID) | ORAL | Status: DC

## 2013-11-01 NOTE — Discharge Instructions (Signed)

## 2013-11-01 NOTE — Lactation Note (Signed)
This note was copied from the chart of Girl Colonel BaldKiri Akel. Lactation Consultation Note  Patient Name: Girl Colonel BaldKiri Kretzschmar MWUXL'KToday's Date: 11/01/2013 Reason for consult: Follow-up assessment Baby 73 hours of life. Mom currently feeding baby formula with bottle. Has been giving primarily formula. Discussed WIC loaner pump with mom. Mom states that she plans to keep offering breast, has an appointment with WIC in the morning, and does not want a loaner from hospital. Mom has a manual hand pump. Discussed engorgement prevention/treatment. Mom referred to Baby and Me booklet for number of diapers to expect and storage of EBM guidelines. [facility] for assistance as needed. Mom aware of OP/BFSG and community resources.   Maternal Data    Feeding Feeding Type:  (Mom feeding baby a bottle.)  LATCH Score/Interventions                      Lactation Tools Discussed/Used     Consult Status Consult Status: Complete    Karsyn Jamie 11/01/2013, 11:00 AM

## 2013-11-01 NOTE — Discharge Summary (Signed)
Attestation of Attending Supervision of Advanced Practitioner (CNM/NP): Evaluation and management procedures were performed by the Advanced Practitioner under my supervision and collaboration.  I have reviewed the Advanced Practitioner's note and chart, and I agree with the management and plan.  CONSTANT,PEGGY 11/01/2013 11:03 AM   

## 2013-11-01 NOTE — Discharge Summary (Signed)
Obstetric Discharge Summary Reason for Admission: onset of labor Prenatal Procedures: ultrasound Intrapartum Procedures: cesarean: low cervical, transverse and T incision Postpartum Procedures: none Complications-Operative and Postpartum: none Hemoglobin  Date Value Ref Range Status  10/30/2013 10.5* 12.0 - 15.0 g/dL Final     DELTA CHECK NOTED     REPEATED TO VERIFY     HCT  Date Value Ref Range Status  10/30/2013 31.1* 36.0 - 46.0 % Final   Hospital Course: Presented with contractions/labor 10/29/13, c-section for nonreassuring fetal heart tones, see c-section op note below. Of note, t-incision was performed.  Patient meeting postoperative milestones: tol diet/ambulation/voiding, +flatus, lochia decreasing, pain controlled. She is breast and bottle feeding, planning for depo-provera for PP contraception.  BP elevated this morning, likely false elevation due to cuff size, repeat BP improved. Denies ha/vc/RUQ pain/edema.   OP note: 10/29/2013  9:42 AM  PATIENT: Tasha Murray 23 y.o. female  PRE-OPERATIVE DIAGNOSIS: 4040w6days, labor, non-reassuring FHR remote from delivery, morbid obesity  POST-OPERATIVE DIAGNOSIS: Same + thick meconium  PROCEDURE: Procedure(s):  CESAREAN SECTION-Low transverse with vertical T (N/A)  SURGEON: Surgeon(s) and Role:  * Allie BossierMyra C Dove, MD - Primary  FINDINGS: Living female infant with Apgars of 2,5,and 9, intact placenta that looked to be older than its stated age, VERY thick meconium  ASSISTANTS: none  ANESTHESIA: epidural and spinal  EBL: Total I/O  In: 2000 [I.V.:2000]  Out: -  BLOOD ADMINISTERED:none  DRAINS: none  LOCAL MEDICATIONS USED: NONE  SPECIMEN: Source of Specimen: cord blood gas and placenta  DISPOSITION OF SPECIMEN: PATHOLOGY  COUNTS: YES  TOURNIQUET: * No tourniquets in log *  DICTATION: .Dragon Dictation  PLAN OF CARE: Admit to inpatient  PATIENT DISPOSITION: PACU - hemodynamically stable.  Delay start of Pharmacological VTE  agent (>24hrs) due to surgical blood loss or risk of bleeding: no  The risks, benefits, and alternatives of surgery were explained, understood, accepted. Consents were signed. All questions were answered. Prior to going to the OR, I placed a scalp electrode to allow for better fetal monitoring. Her cervix was dilated to 3 cm (same as her last exam) and was 99% effaced. The baby was at the +1/+2 station with molding felt. An epidural/spinal was placed for surgery in the OR. Her abdomen was prepped and draped with betadine in a sterile fashion. A Foley catheter had been placed, draining clear urine throughout case. Timeout procedure was done. After adequate anesthesia was assured, an incision was made about 2 cm above the symphysis pubis. She had been given 3 gram of IV ancef preoperatively. The incision was carried down through the subcutaneous tissue to the fascia. The fascia was scored the midline and extended bilaterally. The middle 20% of the rectus muscles were separated in a transverse fashion using electrosurgical technique. Excellent hemostasis was maintained. The peritoneum was entered with hemostats. Peritoneal incision was extended bilaterally with traction. The bladder blade was placed. A transverse incision was made on the well-developed lower uterine segment. The uterine incision was extended with traction on each side. Extremely thick meconium was noted with amniotomy. The baby's arms were coming out of the hysterostomy and I asked to have someone push upwards on the vertex. However, due to the patient's morbid obesity, this process was proving to be very timely (Even moving her large legs apart proved to be very challenging. I therefore made a 4 cm vertical T incision and delivered the baby in a breech fashion.The baby's cord was clamped and cut, and he  was transferred to the NICU personnel for care. The placenta was delivered intact with traction. The uterus was exteriorized and the interior was  cleaned with a dry lap sponge. The vertical portion of the uterine incision was closed with 2-0 Vicryl running locking suture in two layers, the second layer imbricating the first. The transverse portion of the incision was very thin myometrium and I closed this with one layer. Excellent hemostasis was noted. By tilting the uterus each side was able to visualize the adnexa, and they were normal. The rectus fascia rectus muscles were noted be hemostatic as well. The fascia was closed with a 0 Vicryl suture in a running nonlocking fashion. No defects were palpable. The subcutaneous tissue was irrigated, clean, and dried. A subcuticular closure was done with a 3-0 Vicryl suture. Steri-Strips are placed. Excellent cosmetic results were obtained. She was taken to the recovery room in stable condition. She tolerated the procedure well.   Physical Exam:  General: alert and cooperative no distress Lochia: appropriate Uterine Fundus: firm Incision: healing well, no significant drainage, no dehiscence, no significant erythema DVT Evaluation: No evidence of DVT seen on physical exam.  Discharge Diagnoses: Term Pregnancy-delivered and POD#3 s/p Low Transverse C-Section with T incision for nonreassuring fetal heart tones  Discharge Information: Date: 11/01/2013 Activity: pelvic rest Diet: routine Medications: PNV, Ibuprofen and Percocet Condition: stable Instructions: refer to practice specific booklet Discharge to: home Follow-up Information   Call [facility]. (postpartum check up, incision check 4 weeks, If symptoms worsen)    Contact information:   [address] [place] [id number] [phone]      Newborn Data: Live born female  Birth Weight: 5 lb 3.8 oz (2375 g) APGAR: 2, 7  Home with mother.  Sunnie Nielsenlexander, Natalie 11/01/2013, 7:23 AM  I have seen and examined this patient and agree with above documentation in the resident's note.   Rulon AbideKeli Beck, M.D. [facility] 11/01/2013  9:40 AM

## 2013-11-01 NOTE — Progress Notes (Signed)
CSW acknowledges consult for questionable bonding because MOB has asked that baby go to the CN early in hospitalization.  CSW has spoken with staff daily who report no major concerns.  [facility] and CSW will make a [facility] referral.  It appears MOB has had a lot of support with her.  CSW unable to meet with family to complete assessment due to high census/aquity.

## 2013-11-03 ENCOUNTER — Telehealth: Payer: Self-pay | Admitting: Obstetrics & Gynecology

## 2013-11-03 NOTE — Telephone Encounter (Signed)
Spoke with pt. Pt had a c section and her incision is draining. No providers available. [facility] JSY

## 2013-11-06 ENCOUNTER — Telehealth: Payer: Self-pay | Admitting: Obstetrics & Gynecology

## 2013-11-06 ENCOUNTER — Telehealth: Payer: Self-pay | Admitting: *Deleted

## 2013-11-06 NOTE — Telephone Encounter (Signed)
Duplicate telephone message

## 2013-11-06 NOTE — Telephone Encounter (Signed)
[facility]. Pt had a c-section on 10/29/2013. Call transferred to front staff for an appt to be made for tomorrow for incision check.

## 2013-11-07 ENCOUNTER — Encounter: Payer: Self-pay | Admitting: Advanced Practice Midwife

## 2013-11-07 ENCOUNTER — Ambulatory Visit (INDEPENDENT_AMBULATORY_CARE_PROVIDER_SITE_OTHER): Payer: Medicaid Other | Admitting: Advanced Practice Midwife

## 2013-11-07 ENCOUNTER — Encounter: Payer: Medicaid Other | Admitting: Advanced Practice Midwife

## 2013-11-07 VITALS — BP 120/80 | Ht <= 58 in | Wt 295.0 lb

## 2013-11-07 DIAGNOSIS — O909 Complication of the puerperium, unspecified: Secondary | ICD-10-CM

## 2013-11-07 DIAGNOSIS — O8600 Infection of obstetric surgical wound, unspecified: Secondary | ICD-10-CM

## 2013-11-07 MED ORDER — CEPHALEXIN 500 MG PO CAPS: 500.0000 mg | ORAL_CAPSULE | Freq: Three times a day (TID) | ORAL | Status: DC

## 2013-11-12 NOTE — Progress Notes (Signed)
[facility] Visit  Patient name: Tasha Murray MRN [id number]  Date of birth: Dec 08, 1990  CC & HPI:  Tasha FickKiri S Knaak is a 23 y.o. African American female presenting today for incision check.  She had a PLTCS (uterus T'd to get the baby out) d/t NRFHT a week ago.  She has noticed a drainage and odor.  No fevers, no tenderness.    Pertinent History Reviewed:  Medical & Surgical Hx:   Past Medical History  Diagnosis Date  . Medical history non-contributory   . History of gonorrhea    Past Surgical History  Procedure Laterality Date  . No past surgeries    . Cesarean section N/A 10/29/2013    Procedure: CESAREAN SECTION-Low transverse with vertical T;  Surgeon: Allie BossierMyra C Dove, MD;  Location: [facility];  Service: Obstetrics;  Laterality: N/A;   Medications: Reviewed & Updated - see associated section Social History: Reviewed -  reports that she has never smoked. She has never used smokeless tobacco.  Objective Findings:  Vitals: BP 120/80  Ht 4\' 9"  (1.448 m)  Wt 295 lb (133.811 kg)  BMI 63.82 kg/m2  Breastfeeding? Yes  Physical Examination: General appearance - alert, well appearing, and in no distress Mental status - alert, oriented to person, place, and time Abdomen - Abdomen soft and nontender.  Incision is clean and dry without erythema except for a 5mm area on Right edge, which has a small amount of yellow, foul smelling drainage.  Pt had tucked a washcloth under panus, which has drainage across the whole incisional area.  Smells of yeast.  No results found for this or any previous visit (from the past 24 hour(s)).   Assessment & Plan:  A:   Possible wound infection; skin yeast P:  Painted incision with antibiotics and rx'd keflex 500mg  TID for a week   F/U as scheduled for postpartum check   CRESENZO-DISHMAN,Taci Sterling CNM 11/12/2013 4:51 PM

## 2013-12-04 ENCOUNTER — Inpatient Hospital Stay (HOSPITAL_COMMUNITY)
Admission: EM | Admit: 2013-12-04 | Discharge: 2013-12-07 | DRG: 769 | Disposition: A | Discharge status: Home / Self Care | Payer: Medicaid Other | Attending: General Surgery | Admitting: General Surgery

## 2013-12-04 ENCOUNTER — Emergency Department (HOSPITAL_COMMUNITY): Payer: Medicaid Other

## 2013-12-04 ENCOUNTER — Encounter (HOSPITAL_COMMUNITY): Payer: Self-pay | Admitting: Emergency Medicine

## 2013-12-04 DIAGNOSIS — K8020 Calculus of gallbladder without cholecystitis without obstruction: Secondary | ICD-10-CM

## 2013-12-04 DIAGNOSIS — R112 Nausea with vomiting, unspecified: Secondary | ICD-10-CM

## 2013-12-04 DIAGNOSIS — Z8249 Family history of ischemic heart disease and other diseases of the circulatory system: Secondary | ICD-10-CM

## 2013-12-04 DIAGNOSIS — O9089 Other complications of the puerperium, not elsewhere classified: Secondary | ICD-10-CM | POA: Diagnosis present

## 2013-12-04 DIAGNOSIS — K8010 Calculus of gallbladder with chronic cholecystitis without obstruction: Secondary | ICD-10-CM | POA: Diagnosis present

## 2013-12-04 DIAGNOSIS — Z833 Family history of diabetes mellitus: Secondary | ICD-10-CM | POA: Diagnosis not present

## 2013-12-04 DIAGNOSIS — R7989 Other specified abnormal findings of blood chemistry: Secondary | ICD-10-CM

## 2013-12-04 DIAGNOSIS — R945 Abnormal results of liver function studies: Secondary | ICD-10-CM

## 2013-12-04 DIAGNOSIS — Z825 Family history of asthma and other chronic lower respiratory diseases: Secondary | ICD-10-CM

## 2013-12-04 DIAGNOSIS — Z6841 Body Mass Index (BMI) 40.0 and over, adult: Secondary | ICD-10-CM | POA: Diagnosis not present

## 2013-12-04 DIAGNOSIS — O99215 Obesity complicating the puerperium: Secondary | ICD-10-CM | POA: Diagnosis present

## 2013-12-04 DIAGNOSIS — R1010 Upper abdominal pain, unspecified: Secondary | ICD-10-CM

## 2013-12-04 DIAGNOSIS — E669 Obesity, unspecified: Secondary | ICD-10-CM | POA: Diagnosis present

## 2013-12-04 DIAGNOSIS — R109 Unspecified abdominal pain: Secondary | ICD-10-CM | POA: Diagnosis present

## 2013-12-04 LAB — URINALYSIS, ROUTINE W REFLEX MICROSCOPIC
Bilirubin Urine: NEGATIVE
Glucose, UA: NEGATIVE mg/dL
HGB URINE DIPSTICK: NEGATIVE
Ketones, ur: NEGATIVE mg/dL
Leukocytes, UA: NEGATIVE
NITRITE: NEGATIVE
PH: 7.5 (ref 5.0–8.0)
Protein, ur: NEGATIVE mg/dL
SPECIFIC GRAVITY, URINE: 1.015 (ref 1.005–1.030)
Urobilinogen, UA: 2 mg/dL — ABNORMAL HIGH (ref 0.0–1.0)

## 2013-12-04 LAB — CBC WITH DIFFERENTIAL/PLATELET
Basophils Absolute: 0 10*3/uL (ref 0.0–0.1)
Basophils Relative: 0 % (ref 0–1)
EOS PCT: 0 % (ref 0–5)
Eosinophils Absolute: 0 10*3/uL (ref 0.0–0.7)
HEMATOCRIT: 37.8 % (ref 36.0–46.0)
Hemoglobin: 12.5 g/dL (ref 12.0–15.0)
Lymphocytes Relative: 12 % (ref 12–46)
Lymphs Abs: 1.5 10*3/uL (ref 0.7–4.0)
MCH: 28.2 pg (ref 26.0–34.0)
MCHC: 33.1 g/dL (ref 30.0–36.0)
MCV: 85.3 fL (ref 78.0–100.0)
Monocytes Absolute: 0.5 10*3/uL (ref 0.1–1.0)
Monocytes Relative: 4 % (ref 3–12)
Neutro Abs: 10.7 10*3/uL — ABNORMAL HIGH (ref 1.7–7.7)
Neutrophils Relative %: 84 % — ABNORMAL HIGH (ref 43–77)
PLATELETS: 231 10*3/uL (ref 150–400)
RBC: 4.43 MIL/uL (ref 3.87–5.11)
RDW: 15.1 % (ref 11.5–15.5)
WBC: 12.7 10*3/uL — AB (ref 4.0–10.5)

## 2013-12-04 LAB — COMPREHENSIVE METABOLIC PANEL
ALT: 59 U/L — AB (ref 0–35)
AST: 110 U/L — ABNORMAL HIGH (ref 0–37)
Albumin: 3.6 g/dL (ref 3.5–5.2)
Alkaline Phosphatase: 150 U/L — ABNORMAL HIGH (ref 39–117)
Anion gap: 11 (ref 5–15)
BILIRUBIN TOTAL: 0.8 mg/dL (ref 0.3–1.2)
BUN: 14 mg/dL (ref 6–23)
CALCIUM: 9.3 mg/dL (ref 8.4–10.5)
CHLORIDE: 101 meq/L (ref 96–112)
CO2: 27 meq/L (ref 19–32)
Creatinine, Ser: 0.89 mg/dL (ref 0.50–1.10)
Glucose, Bld: 162 mg/dL — ABNORMAL HIGH (ref 70–99)
Potassium: 4.2 mEq/L (ref 3.7–5.3)
SODIUM: 139 meq/L (ref 137–147)
Total Protein: 7.8 g/dL (ref 6.0–8.3)

## 2013-12-04 LAB — LIPASE, BLOOD: Lipase: 24 U/L (ref 11–59)

## 2013-12-04 LAB — PREGNANCY, URINE: Preg Test, Ur: NEGATIVE

## 2013-12-04 MED ORDER — PANTOPRAZOLE SODIUM 40 MG PO TBEC
40.0000 mg | DELAYED_RELEASE_TABLET | Freq: Every day | ORAL | Status: DC
  Administered 2013-12-04 – 2013-12-07 (×3): 40 mg via ORAL
  Filled 2013-12-04 (×4): qty 1

## 2013-12-04 MED ORDER — ONDANSETRON 4 MG PO TBDP: ORAL_TABLET | ORAL | Status: DC

## 2013-12-04 MED ORDER — ONDANSETRON 4 MG PO TBDP
4.0000 mg | ORAL_TABLET | Freq: Once | ORAL | Status: AC
  Administered 2013-12-04: 4 mg via ORAL
  Filled 2013-12-04: qty 1

## 2013-12-04 MED ORDER — ONDANSETRON HCL 4 MG/2ML IJ SOLN
4.0000 mg | Freq: Four times a day (QID) | INTRAMUSCULAR | Status: DC | PRN
  Administered 2013-12-04 – 2013-12-05 (×2): 4 mg via INTRAVENOUS
  Filled 2013-12-04 (×2): qty 2

## 2013-12-04 MED ORDER — SODIUM CHLORIDE 0.9 % IV SOLN
1000.0000 mL | Freq: Once | INTRAVENOUS | Status: AC
  Administered 2013-12-04: 1000 mL via INTRAVENOUS

## 2013-12-04 MED ORDER — PIPERACILLIN-TAZOBACTAM 3.375 G IVPB
3.3750 g | Freq: Three times a day (TID) | INTRAVENOUS | Status: DC
  Administered 2013-12-04 – 2013-12-07 (×9): 3.375 g via INTRAVENOUS
  Filled 2013-12-04 (×13): qty 50

## 2013-12-04 MED ORDER — ACETAMINOPHEN 650 MG RE SUPP: 650.0000 mg | Freq: Four times a day (QID) | RECTAL | Status: DC | PRN

## 2013-12-04 MED ORDER — HYDROMORPHONE HCL PF 1 MG/ML IJ SOLN
1.0000 mg | INTRAMUSCULAR | Status: DC | PRN
  Administered 2013-12-06 (×2): 1 mg via INTRAVENOUS
  Filled 2013-12-04 (×2): qty 1

## 2013-12-04 MED ORDER — DIPHENHYDRAMINE HCL 50 MG/ML IJ SOLN: 12.5000 mg | Freq: Four times a day (QID) | INTRAMUSCULAR | Status: DC | PRN

## 2013-12-04 MED ORDER — ENOXAPARIN SODIUM 40 MG/0.4ML ~~LOC~~ SOLN
40.0000 mg | SUBCUTANEOUS | Status: DC
  Administered 2013-12-04 – 2013-12-07 (×3): 40 mg via SUBCUTANEOUS
  Filled 2013-12-04 (×3): qty 0.4

## 2013-12-04 MED ORDER — SODIUM CHLORIDE 0.9 % IV SOLN
Freq: Once | INTRAVENOUS | Status: AC
  Administered 2013-12-04: 10:00:00 via INTRAVENOUS

## 2013-12-04 MED ORDER — DIPHENHYDRAMINE HCL 12.5 MG/5ML PO ELIX: 12.5000 mg | ORAL_SOLUTION | Freq: Four times a day (QID) | ORAL | Status: DC | PRN

## 2013-12-04 MED ORDER — HYDROMORPHONE HCL PF 1 MG/ML IJ SOLN: 0.5000 mg | Freq: Once | INTRAMUSCULAR | Status: DC

## 2013-12-04 MED ORDER — ACYCLOVIR 400 MG PO TABS
400.0000 mg | ORAL_TABLET | Freq: Three times a day (TID) | ORAL | Status: DC
  Filled 2013-12-04 (×6): qty 1

## 2013-12-04 MED ORDER — ACYCLOVIR 200 MG PO CAPS: 400.0000 mg | ORAL_CAPSULE | Freq: Three times a day (TID) | ORAL | Status: DC

## 2013-12-04 MED ORDER — ACYCLOVIR 200 MG PO CAPS
400.0000 mg | ORAL_CAPSULE | Freq: Three times a day (TID) | ORAL | Status: DC
  Administered 2013-12-04 – 2013-12-07 (×9): 400 mg via ORAL
  Filled 2013-12-04 (×9): qty 2

## 2013-12-04 MED ORDER — ACETAMINOPHEN 325 MG PO TABS
650.0000 mg | ORAL_TABLET | Freq: Four times a day (QID) | ORAL | Status: DC | PRN
  Administered 2013-12-05: 650 mg via ORAL
  Filled 2013-12-04: qty 2

## 2013-12-04 MED ORDER — MORPHINE SULFATE 4 MG/ML IJ SOLN
4.0000 mg | Freq: Once | INTRAMUSCULAR | Status: AC
  Administered 2013-12-04: 4 mg via INTRAVENOUS
  Filled 2013-12-04: qty 1

## 2013-12-04 MED ORDER — KCL IN DEXTROSE-NACL 20-5-0.45 MEQ/L-%-% IV SOLN
INTRAVENOUS | Status: DC
Start: 2013-12-04 — End: 2013-12-06
  Administered 2013-12-04 – 2013-12-05 (×5): via INTRAVENOUS

## 2013-12-04 MED ORDER — PROMETHAZINE HCL 25 MG/ML IJ SOLN
12.5000 mg | Freq: Once | INTRAMUSCULAR | Status: AC
  Administered 2013-12-04: 12.5 mg via INTRAVENOUS
  Filled 2013-12-04: qty 1

## 2013-12-04 MED ORDER — OXYCODONE HCL 5 MG PO TABS
5.0000 mg | ORAL_TABLET | ORAL | Status: DC | PRN
  Administered 2013-12-05: 5 mg via ORAL
  Filled 2013-12-04: qty 1

## 2013-12-04 MED ORDER — ONDANSETRON HCL 4 MG/2ML IJ SOLN
4.0000 mg | Freq: Once | INTRAMUSCULAR | Status: AC
  Administered 2013-12-04: 4 mg via INTRAVENOUS
  Filled 2013-12-04: qty 2

## 2013-12-04 NOTE — ED Provider Notes (Addendum)
CSN: [id number]     Arrival date & time 12/04/13  [id number] History   First MD Initiated Contact with Patient 12/04/13 0402     Chief Complaint  Patient presents with  . Abdominal Pain     (Consider location/radiation/quality/duration/timing/severity/associated sxs/prior Treatment) HPI Comments: 23 year old female with obesity, pregnancy history, C-section presents with upper abdominal pain and vomiting since this evening. Patient does not recall similar event in the past. Patient denies gallbladder problems and is not currently pregnant. Nothing improves symptoms, multiple nonbloody vomiting. No known ulcer or other abdominal surgery history. Mild radiation to the back. Patient denies alcohol or drugs.  Patient is a 23 y.o. female presenting with abdominal pain. The history is provided by the patient.  Abdominal Pain Associated symptoms: nausea and vomiting   Associated symptoms: no chest pain, no chills, no dysuria, no fever and no shortness of breath     Past Medical History  Diagnosis Date  . Medical history non-contributory   . History of gonorrhea    Past Surgical History  Procedure Laterality Date  . No past surgeries    . Cesarean section N/A 10/29/2013    Procedure: CESAREAN SECTION-Low transverse with vertical T;  Surgeon: Allie BossierMyra C Dove, MD;  Location: [facility];  Service: Obstetrics;  Laterality: N/A;   Family History  Problem Relation Age of Onset  . Heart disease Mother   . Arthritis Mother   . Asthma Mother   . Heart disease Maternal Grandmother   . Diabetes Maternal Grandmother    History  Substance Use Topics  . Smoking status: Never Smoker   . Smokeless tobacco: Never Used  . Alcohol Use: No   OB History   Grav Para Term Preterm Abortions TAB SAB Ect Mult Living   1 1 1       1      Review of Systems  Constitutional: Positive for appetite change. Negative for fever and chills.  HENT: Negative for congestion.   Eyes: Negative for visual disturbance.   Respiratory: Negative for shortness of breath.   Cardiovascular: Negative for chest pain.  Gastrointestinal: Positive for nausea, vomiting and abdominal pain.  Genitourinary: Negative for dysuria and flank pain.  Musculoskeletal: Negative for back pain, neck pain and neck stiffness.  Skin: Negative for rash.  Neurological: Negative for light-headedness and headaches.      Allergies  Review of patient's allergies indicates no known allergies.  Home Medications   Prior to Admission medications   Medication Sig Start Date End Date Taking? Authorizing Provider  acyclovir (ZOVIRAX) 400 MG tablet Take 1 tablet (400 mg total) by mouth 3 (three) times daily. 09/14/13  Yes Scarlette CalicoFrances Cresenzo-Dishmon, CNM  cephALEXin (KEFLEX) 500 MG capsule Take 1 capsule (500 mg total) by mouth 3 (three) times daily. 11/07/13  Yes Jacklyn ShellFrances Cresenzo-Dishmon, CNM  oxyCODONE-acetaminophen (PERCOCET/ROXICET) 5-325 MG per tablet Take 1-2 tablets by mouth every 4 (four) hours as needed for severe pain (moderate - severe pain). 11/01/13  Yes Aviva SignsMarie L Williams, CNM  prenatal vitamin w/FE, FA (PRENATAL 1 + 1) 27-1 MG TABS tablet Take 1 tab daily 06/05/13  Yes Cheron EveryKimberly Randall Booker, CNM  ibuprofen (ADVIL,MOTRIN) 600 MG tablet Take 1 tablet (600 mg total) by mouth every 6 (six) hours. 11/01/13   Allie BossierMyra C Dove, MD   BP 121/81  Pulse 84  Temp(Src) 97.9 F (36.6 C) (Oral)  Resp 16  Ht 5\' 4"  (1.626 m)  Wt 292 lb (132.45 kg)  BMI 50.10 kg/m2  SpO2 100% Physical Exam  Nursing note and vitals reviewed. Constitutional: She is oriented to person, place, and time. She appears well-developed and well-nourished.  HENT:  Head: Normocephalic and atraumatic.  Mild dry mucous membranes  Eyes: Conjunctivae are normal. Right eye exhibits no discharge. Left eye exhibits no discharge.  Neck: Normal range of motion. Neck supple. No tracheal deviation present.  Cardiovascular: Normal rate and regular rhythm.   Pulmonary/Chest: Effort normal  and breath sounds normal.  Abdominal: Soft. She exhibits no distension. There is tenderness (right upper quadrant and epigastric). There is no guarding.  Musculoskeletal: She exhibits no edema.  Neurological: She is alert and oriented to person, place, and time.  Skin: Skin is warm. No rash noted.  Psychiatric: She has a normal mood and affect.    ED Course  Procedures (including critical care time)  EMERGENCY DEPARTMENT BILIARY ULTRASOUND INTERPRETATION "Study: Limited Abdominal Ultrasound of the gallbladder and common bile duct."  INDICATIONS: Abdominal pain and Nausea Indication: Multiple views of the gallbladder and common bile duct were obtained in real-time with a Multi-frequency probe." PERFORMED BY:  Myself IMAGES ARCHIVED?: Yes FINDINGS: Gallstones present, Gallbladder wall normal in thickness, Sonographic Murphy's sign absent and Common bile duct normal in size LIMITATIONS: Body Habitus INTERPRETATION: Cholelithiasis   Labs Review Labs Reviewed  URINALYSIS, ROUTINE W REFLEX MICROSCOPIC - Abnormal; Notable for the following:    APPearance HAZY (*)    Urobilinogen, UA 2.0 (*)    All other components within normal limits  CBC WITH DIFFERENTIAL - Abnormal; Notable for the following:    WBC 12.7 (*)    Neutrophils Relative % 84 (*)    Neutro Abs 10.7 (*)    All other components within normal limits  COMPREHENSIVE METABOLIC PANEL - Abnormal; Notable for the following:    Glucose, Bld 162 (*)    AST 110 (*)    ALT 59 (*)    Alkaline Phosphatase 150 (*)    All other components within normal limits  PREGNANCY, URINE  LIPASE, BLOOD    Imaging Review No results found.   EKG Interpretation None      MDM   Final diagnoses:  Symptomatic cholelithiasis  LFT elevation  Non-intractable vomiting with nausea, vomiting of unspecified type  Upper abdominal pain   Clinical concern for gastritis versus gallbladder disease. Bedside ultrasound showed multiple shadowing  gallstones no wall thickening. Patient improved with IV fluids and pain meds and nausea meds however LFTs elevated. Plan for formal ultrasound for further details and either close outpatient followup versus surgery consult pending results. Patient signed out to followup formal ultrasound and reassess.  Filed Vitals:   12/04/13 0255  BP: 121/81  Pulse: 84  Temp: 97.9 F (36.6 C)  Resp: [address]       Enid Skeens, MD 12/04/13 9604  Enid Skeens, MD 12/16/13 [phone]

## 2013-12-04 NOTE — ED Notes (Signed)
Pt reports upper abd pain with vomiitng

## 2013-12-04 NOTE — H&P (Signed)
Tasha Murray is an 23 y.o. female.   Chief Complaint: Nausea and vomiting HPI: Patient is a 23 year old black female 4 weeks postpartum who presented with a 24-hour history of worsening right upper quadrant abdominal pain, nausea, vomiting. She presented emergency room for further evaluation treatment. She is on ultrasound the gallbladder had cholelithiasis. Her liver enzyme tests are slightly elevated. Total bilirubin is within normal limits.  Past Medical History  Diagnosis Date  . Medical history non-contributory   . History of gonorrhea     Past Surgical History  Procedure Laterality Date  . No past surgeries    . Cesarean section N/A 10/29/2013    Procedure: CESAREAN SECTION-Low transverse with vertical T;  Surgeon: Emily Filbert, MD;  Location: [facility];  Service: Obstetrics;  Laterality: N/A;    Family History  Problem Relation Age of Onset  . Heart disease Mother   . Arthritis Mother   . Asthma Mother   . Heart disease Maternal Grandmother   . Diabetes Maternal Grandmother    Social History:  reports that she has never smoked. She has never used smokeless tobacco. She reports that she does not drink alcohol or use illicit drugs.  Allergies: No Known Allergies   (Not in a hospital admission)  Results for orders placed during the hospital encounter of 12/04/13 (from the past 48 hour(s))  URINALYSIS, ROUTINE W REFLEX MICROSCOPIC     Status: Abnormal   Collection Time    12/04/13  3:24 AM      Result Value Ref Range   Color, Urine YELLOW  YELLOW   APPearance HAZY (*) CLEAR   Specific Gravity, Urine 1.015  1.005 - 1.030   pH 7.5  5.0 - 8.0   Glucose, UA NEGATIVE  NEGATIVE mg/dL   Hgb urine dipstick NEGATIVE  NEGATIVE   Bilirubin Urine NEGATIVE  NEGATIVE   Ketones, ur NEGATIVE  NEGATIVE mg/dL   Protein, ur NEGATIVE  NEGATIVE mg/dL   Urobilinogen, UA 2.0 (*) 0.0 - 1.0 mg/dL   Nitrite NEGATIVE  NEGATIVE   Leukocytes, UA NEGATIVE  NEGATIVE   Comment: MICROSCOPIC NOT DONE  ON URINES WITH NEGATIVE PROTEIN, BLOOD, LEUKOCYTES, NITRITE, OR GLUCOSE <1000 mg/dL.  PREGNANCY, URINE     Status: None   Collection Time    12/04/13  3:24 AM      Result Value Ref Range   Preg Test, Ur NEGATIVE  NEGATIVE  CBC WITH DIFFERENTIAL     Status: Abnormal   Collection Time    12/04/13  5:12 AM      Result Value Ref Range   WBC 12.7 (*) 4.0 - 10.5 K/uL   RBC 4.43  3.87 - 5.11 MIL/uL   Hemoglobin 12.5  12.0 - 15.0 g/dL   HCT 37.8  36.0 - 46.0 %   MCV 85.3  78.0 - 100.0 fL   MCH 28.2  26.0 - 34.0 pg   MCHC 33.1  30.0 - 36.0 g/dL   RDW 15.1  11.5 - 15.5 %   Platelets 231  150 - 400 K/uL   Neutrophils Relative % 84 (*) 43 - 77 %   Neutro Abs 10.7 (*) 1.7 - 7.7 K/uL   Lymphocytes Relative 12  12 - 46 %   Lymphs Abs 1.5  0.7 - 4.0 K/uL   Monocytes Relative 4  3 - 12 %   Monocytes Absolute 0.5  0.1 - 1.0 K/uL   Eosinophils Relative 0  0 - 5 %   Eosinophils  Absolute 0.0  0.0 - 0.7 K/uL   Basophils Relative 0  0 - 1 %   Basophils Absolute 0.0  0.0 - 0.1 K/uL  COMPREHENSIVE METABOLIC PANEL     Status: Abnormal   Collection Time    12/04/13  5:12 AM      Result Value Ref Range   Sodium 139  137 - 147 mEq/L   Potassium 4.2  3.7 - 5.3 mEq/L   Chloride 101  96 - 112 mEq/L   CO2 27  19 - 32 mEq/L   Glucose, Bld 162 (*) 70 - 99 mg/dL   BUN 14  6 - 23 mg/dL   Creatinine, Ser 0.89  0.50 - 1.10 mg/dL   Calcium 9.3  8.4 - 10.5 mg/dL   Total Protein 7.8  6.0 - 8.3 g/dL   Albumin 3.6  3.5 - 5.2 g/dL   AST 110 (*) 0 - 37 U/L   ALT 59 (*) 0 - 35 U/L   Alkaline Phosphatase 150 (*) 39 - 117 U/L   Total Bilirubin 0.8  0.3 - 1.2 mg/dL   GFR calc non Af Amer >90  >90 mL/min   GFR calc Af Amer >90  >90 mL/min   Comment: (NOTE)     The eGFR has been calculated using the CKD EPI equation.     This calculation has not been validated in all clinical situations.     eGFR's persistently <90 mL/min signify possible Chronic Kidney     Disease.   Anion gap 11  5 - 15  LIPASE, BLOOD      Status: None   Collection Time    12/04/13  5:12 AM      Result Value Ref Range   Lipase 24  11 - 59 U/L   US Abdomen Complete  12/04/2013   CLINICAL DATA:  Abdominal pain with elevated liver enzymes  EXAM: ULTRASOUND ABDOMEN COMPLETE  COMPARISON:  None.  FINDINGS: Gallbladder:  There are multiple small echogenic foci in the gallbladder which move and shadow consistent with small gallstones. There is no pericholecystic fluid. The gallbladder wall is thickened and appears subtly edematous, however. No sonographic Murphy sign noted.  Common bile duct:  Diameter: 5 mm. There is no appreciable intrahepatic, common hepatic, or common bile duct dilatation.  Liver:  No focal lesion identified. Within normal limits in parenchymal echogenicity. Liver measures 17.1 cm in length, prominent.  IVC:  No abnormality visualized.  Pancreas:  No mass or inflammatory focus.  Spleen:  Size and appearance within normal limits.  Right Kidney:  Length: 10.2 cm. Echogenicity within normal limits. No mass or hydronephrosis visualized.  Left Kidney:  Length: 10.8 cm. Echogenicity within normal limits. No mass or hydronephrosis visualized.  Abdominal aorta:  No aneurysm visualized.  Other findings:  No demonstrable ascites.  IMPRESSION: Cholelithiasis with mild gallbladder wall thickening. Question early cholecystitis.  Liver is prominent but otherwise unremarkable in appearance.  Study otherwise unremarkable.   Electronically Signed   By: Lowella Grip M.D.   On: 12/04/2013 08:38    Review of Systems  Constitutional: Positive for malaise/fatigue. Negative for fever.  HENT: Negative.   Eyes: Negative.   Respiratory: Negative.   Cardiovascular: Negative.   Gastrointestinal: Positive for heartburn, nausea, vomiting and abdominal pain.  Genitourinary: Negative.   Musculoskeletal: Negative.   Skin: Negative.     Blood pressure 117/76, pulse 79, temperature 97.9 F (36.6 C), temperature source Oral, resp. rate 15, height  '5\' 4"'  (1.626 m),  weight 132.45 kg (292 lb), SpO2 100.00%, currently breastfeeding. Physical Exam  Constitutional: She is oriented to person, place, and time. She appears well-developed and well-nourished.  HENT:  Head: Normocephalic and atraumatic.  Eyes: No scleral icterus.  Neck: Normal range of motion. Neck supple.  Cardiovascular: Normal rate, regular rhythm and normal heart sounds.   Respiratory: Effort normal and breath sounds normal.  GI: Soft. She exhibits no distension. There is tenderness. There is no rebound.  Tender in right upper quadrant to palpation. No rigidity noted.  Musculoskeletal: Normal range of motion.  Neurological: She is alert and oriented to person, place, and time.  Skin: Skin is warm and dry.     Assessment/Plan Impression: Cholecystitis, cholelithiasis Plan: Will admit the patient to the hospital for control of her nausea and vomiting. She'll be started on Zosyn. She was subsequently undergo a laparoscopic cholecystectomy. The risks and benefits of the procedure including bleeding, infection, hepatobiliary, and the possibility of an open procedure were fully explained to the patient, who gave informed consent.  Dillinger Aston A 12/04/2013, 10:09 AM

## 2013-12-05 LAB — BASIC METABOLIC PANEL
Anion gap: 10 (ref 5–15)
BUN: 8 mg/dL (ref 6–23)
CO2: 27 meq/L (ref 19–32)
CREATININE: 0.82 mg/dL (ref 0.50–1.10)
Calcium: 9.1 mg/dL (ref 8.4–10.5)
Chloride: 102 mEq/L (ref 96–112)
GFR calc Af Amer: >90 mL/min (ref >90)
GFR calc non Af Amer: >90 mL/min (ref >90)
Glucose, Bld: 127 mg/dL — ABNORMAL HIGH (ref 70–99)
POTASSIUM: 4 meq/L (ref 3.7–5.3)
Sodium: 139 mEq/L (ref 137–147)

## 2013-12-05 LAB — HEPATIC FUNCTION PANEL
ALBUMIN: 3.3 g/dL — AB (ref 3.5–5.2)
ALT: 47 U/L — ABNORMAL HIGH (ref 0–35)
AST: 27 U/L (ref 0–37)
Alkaline Phosphatase: 147 U/L — ABNORMAL HIGH (ref 39–117)
Bilirubin, Direct: <0.2 mg/dL (ref 0.0–0.3)
TOTAL PROTEIN: 7.3 g/dL (ref 6.0–8.3)
Total Bilirubin: 0.4 mg/dL (ref 0.3–1.2)

## 2013-12-05 LAB — SURGICAL PCR SCREEN
MRSA, PCR: NEGATIVE
STAPHYLOCOCCUS AUREUS: NEGATIVE

## 2013-12-05 LAB — MAGNESIUM: Magnesium: 2 mg/dL (ref 1.5–2.5)

## 2013-12-05 LAB — PHOSPHORUS: Phosphorus: 3.5 mg/dL (ref 2.3–4.6)

## 2013-12-05 NOTE — Care Management Utilization Note (Signed)
UR completed 

## 2013-12-05 NOTE — Progress Notes (Signed)
Liver enzyme tests are normalizing. Patient still with some epigastric pain. Will proceed with laparoscopic cholecystectomy tomorrow. The risks and benefits of the procedure including bleeding, infection, hepatobiliary injury, and the possibility of an open procedure were fully explained to the patient, who gave informed consent.

## 2013-12-06 ENCOUNTER — Encounter (HOSPITAL_COMMUNITY)
Admission: EM | Disposition: A | Discharge status: Home / Self Care | Payer: Self-pay | Source: Home / Self Care | Attending: General Surgery

## 2013-12-06 ENCOUNTER — Inpatient Hospital Stay (HOSPITAL_COMMUNITY): Payer: Medicaid Other | Admitting: Anesthesiology

## 2013-12-06 ENCOUNTER — Encounter (HOSPITAL_COMMUNITY): Payer: Medicaid Other | Admitting: Anesthesiology

## 2013-12-06 ENCOUNTER — Encounter (HOSPITAL_COMMUNITY): Payer: Self-pay | Admitting: *Deleted

## 2013-12-06 HISTORY — PX: CHOLECYSTECTOMY: SHX55

## 2013-12-06 SURGERY — LAPAROSCOPIC CHOLECYSTECTOMY
Anesthesia: General

## 2013-12-06 MED ORDER — ROCURONIUM BROMIDE 100 MG/10ML IV SOLN
INTRAVENOUS | Status: DC | PRN
  Administered 2013-12-06: 25 mg via INTRAVENOUS
  Administered 2013-12-06: 5 mg via INTRAVENOUS

## 2013-12-06 MED ORDER — FENTANYL CITRATE 0.05 MG/ML IJ SOLN
INTRAMUSCULAR | Status: DC | PRN
  Administered 2013-12-06 (×3): 50 ug via INTRAVENOUS
  Administered 2013-12-06: 100 ug via INTRAVENOUS

## 2013-12-06 MED ORDER — ONDANSETRON HCL 4 MG/2ML IJ SOLN
4.0000 mg | Freq: Once | INTRAMUSCULAR | Status: AC
  Administered 2013-12-06: 4 mg via INTRAVENOUS

## 2013-12-06 MED ORDER — KETOROLAC TROMETHAMINE 30 MG/ML IJ SOLN
INTRAMUSCULAR | Status: AC
  Filled 2013-12-06: qty 1

## 2013-12-06 MED ORDER — LACTATED RINGERS IV SOLN
INTRAVENOUS | Status: DC
  Administered 2013-12-07: 03:00:00 via INTRAVENOUS

## 2013-12-06 MED ORDER — ONDANSETRON HCL 4 MG/2ML IJ SOLN: 4.0000 mg | Freq: Once | INTRAMUSCULAR | Status: DC | PRN

## 2013-12-06 MED ORDER — SODIUM CHLORIDE 0.9 % IR SOLN
Status: DC | PRN
  Administered 2013-12-06: 1000 mL

## 2013-12-06 MED ORDER — POVIDONE-IODINE 10 % EX OINT
TOPICAL_OINTMENT | CUTANEOUS | Status: AC
  Filled 2013-12-06: qty 1

## 2013-12-06 MED ORDER — CHLORHEXIDINE GLUCONATE 4 % EX LIQD
1.0000 "application " | Freq: Once | CUTANEOUS | Status: AC
  Administered 2013-12-06: 1 via TOPICAL
  Filled 2013-12-06: qty 15

## 2013-12-06 MED ORDER — PROPOFOL 10 MG/ML IV EMUL
INTRAVENOUS | Status: AC
  Filled 2013-12-06: qty 20

## 2013-12-06 MED ORDER — BUPIVACAINE HCL (PF) 0.5 % IJ SOLN
INTRAMUSCULAR | Status: AC
  Filled 2013-12-06: qty 30

## 2013-12-06 MED ORDER — NEOSTIGMINE METHYLSULFATE 10 MG/10ML IV SOLN
INTRAVENOUS | Status: DC | PRN
  Administered 2013-12-06: 3 mg via INTRAVENOUS

## 2013-12-06 MED ORDER — GLYCOPYRROLATE 0.2 MG/ML IJ SOLN
INTRAMUSCULAR | Status: AC
  Filled 2013-12-06: qty 1

## 2013-12-06 MED ORDER — GLYCOPYRROLATE 0.2 MG/ML IJ SOLN
0.2000 mg | Freq: Once | INTRAMUSCULAR | Status: AC
  Administered 2013-12-06: 0.2 mg via INTRAVENOUS

## 2013-12-06 MED ORDER — ROCURONIUM BROMIDE 50 MG/5ML IV SOLN
INTRAVENOUS | Status: AC
  Filled 2013-12-06: qty 1

## 2013-12-06 MED ORDER — HEMOSTATIC AGENTS (NO CHARGE) OPTIME
TOPICAL | Status: DC | PRN
  Administered 2013-12-06: 1 via TOPICAL

## 2013-12-06 MED ORDER — SUCCINYLCHOLINE CHLORIDE 20 MG/ML IJ SOLN
INTRAMUSCULAR | Status: AC
  Filled 2013-12-06: qty 1

## 2013-12-06 MED ORDER — FENTANYL CITRATE 0.05 MG/ML IJ SOLN
25.0000 ug | INTRAMUSCULAR | Status: DC | PRN
  Administered 2013-12-06 (×2): 50 ug via INTRAVENOUS

## 2013-12-06 MED ORDER — FENTANYL CITRATE 0.05 MG/ML IJ SOLN
INTRAMUSCULAR | Status: AC
  Filled 2013-12-06: qty 2

## 2013-12-06 MED ORDER — GLYCOPYRROLATE 0.2 MG/ML IJ SOLN
INTRAMUSCULAR | Status: AC
  Filled 2013-12-06: qty 2

## 2013-12-06 MED ORDER — LIDOCAINE HCL (CARDIAC) 10 MG/ML IV SOLN
INTRAVENOUS | Status: DC | PRN
  Administered 2013-12-06: 10 mg via INTRAVENOUS

## 2013-12-06 MED ORDER — ONDANSETRON HCL 4 MG/2ML IJ SOLN
INTRAMUSCULAR | Status: AC
  Filled 2013-12-06: qty 2

## 2013-12-06 MED ORDER — BUPIVACAINE HCL (PF) 0.5 % IJ SOLN
INTRAMUSCULAR | Status: DC | PRN
  Administered 2013-12-06: 10 mL

## 2013-12-06 MED ORDER — POVIDONE-IODINE 10 % OINT PACKET
TOPICAL_OINTMENT | CUTANEOUS | Status: DC | PRN
  Administered 2013-12-06: 1 via TOPICAL

## 2013-12-06 MED ORDER — LIDOCAINE HCL (PF) 1 % IJ SOLN
INTRAMUSCULAR | Status: AC
  Filled 2013-12-06: qty 5

## 2013-12-06 MED ORDER — LACTATED RINGERS IV SOLN
INTRAVENOUS | Status: DC
  Administered 2013-12-06: 12:00:00 via INTRAVENOUS

## 2013-12-06 MED ORDER — PROPOFOL 10 MG/ML IV BOLUS
INTRAVENOUS | Status: DC | PRN
  Administered 2013-12-06: 20 mg via INTRAVENOUS
  Administered 2013-12-06: 180 mg via INTRAVENOUS

## 2013-12-06 MED ORDER — KETOROLAC TROMETHAMINE 30 MG/ML IJ SOLN
30.0000 mg | Freq: Once | INTRAMUSCULAR | Status: AC
  Administered 2013-12-06: 30 mg via INTRAVENOUS

## 2013-12-06 MED ORDER — MIDAZOLAM HCL 2 MG/2ML IJ SOLN
INTRAMUSCULAR | Status: AC
  Filled 2013-12-06: qty 2

## 2013-12-06 MED ORDER — MIDAZOLAM HCL 2 MG/2ML IJ SOLN
1.0000 mg | INTRAMUSCULAR | Status: DC | PRN
  Administered 2013-12-06: 2 mg via INTRAVENOUS

## 2013-12-06 MED ORDER — GLYCOPYRROLATE 0.2 MG/ML IJ SOLN
INTRAMUSCULAR | Status: DC | PRN
  Administered 2013-12-06: 0.4 mg via INTRAVENOUS

## 2013-12-06 MED ORDER — KCL IN DEXTROSE-NACL 20-5-0.45 MEQ/L-%-% IV SOLN
INTRAVENOUS | Status: DC
  Administered 2013-12-06: 06:00:00 via INTRAVENOUS

## 2013-12-06 MED ORDER — SUCCINYLCHOLINE CHLORIDE 20 MG/ML IJ SOLN
INTRAMUSCULAR | Status: DC | PRN
  Administered 2013-12-06: 140 mg via INTRAVENOUS

## 2013-12-06 MED ORDER — FENTANYL CITRATE 0.05 MG/ML IJ SOLN
INTRAMUSCULAR | Status: AC
  Filled 2013-12-06: qty 5

## 2013-12-06 SURGICAL SUPPLY — 42 items
APPLIER CLIP LAPSCP 10X32 DD (CLIP) ×3 IMPLANT
BAG HAMPER (MISCELLANEOUS) ×3 IMPLANT
BAG SPEC RTRVL LRG 6X4 10 (ENDOMECHANICALS) ×1
BLADE 11 SAFETY STRL DISP (BLADE) ×3 IMPLANT
CLOTH BEACON ORANGE TIMEOUT ST (SAFETY) ×3 IMPLANT
COVER LIGHT HANDLE STERIS (MISCELLANEOUS) ×6 IMPLANT
DECANTER SPIKE VIAL GLASS SM (MISCELLANEOUS) ×3 IMPLANT
DURAPREP 26ML APPLICATOR (WOUND CARE) ×3 IMPLANT
ELECT REM PT RETURN 9FT ADLT (ELECTROSURGICAL) ×3
ELECTRODE REM PT RTRN 9FT ADLT (ELECTROSURGICAL) ×1 IMPLANT
FILTER SMOKE EVAC LAPAROSHD (FILTER) ×3 IMPLANT
FORMALIN 10 PREFIL 480ML (MISCELLANEOUS) ×2 IMPLANT
GLOVE BIOGEL PI IND STRL 7.0 (GLOVE) IMPLANT
GLOVE BIOGEL PI INDICATOR 7.0 (GLOVE) ×10
GLOVE ECLIPSE 6.5 STRL STRAW (GLOVE) ×2 IMPLANT
GLOVE SS BIOGEL STRL SZ 6.5 (GLOVE) IMPLANT
GLOVE SUPERSENSE BIOGEL SZ 6.5 (GLOVE) ×2
GLOVE SURG SS PI 7.5 STRL IVOR (GLOVE) ×3 IMPLANT
GOWN STRL REUS W/TWL LRG LVL3 (GOWN DISPOSABLE) ×13 IMPLANT
HEMOSTAT SNOW SURGICEL 2X4 (HEMOSTASIS) ×3 IMPLANT
INST SET LAPROSCOPIC AP (KITS) ×3 IMPLANT
KIT ROOM TURNOVER APOR (KITS) ×3 IMPLANT
MANIFOLD NEPTUNE II (INSTRUMENTS) ×3 IMPLANT
NDL INSUFFLATION 14GA 120MM (NEEDLE) ×1 IMPLANT
NEEDLE INSUFFLATION 14GA 120MM (NEEDLE) ×3 IMPLANT
NS IRRIG 1000ML POUR BTL (IV SOLUTION) ×3 IMPLANT
PACK LAP CHOLE LZT030E (CUSTOM PROCEDURE TRAY) ×3 IMPLANT
PAD ARMBOARD 7.5X6 YLW CONV (MISCELLANEOUS) ×3 IMPLANT
POUCH SPECIMEN RETRIEVAL 10MM (ENDOMECHANICALS) ×3 IMPLANT
SET BASIN LINEN APH (SET/KITS/TRAYS/PACK) ×3 IMPLANT
SLEEVE ENDOPATH XCEL 5M (ENDOMECHANICALS) ×3 IMPLANT
SPONGE GAUZE 2X2 8PLY STER LF (GAUZE/BANDAGES/DRESSINGS) ×4
SPONGE GAUZE 2X2 8PLY STRL LF (GAUZE/BANDAGES/DRESSINGS) ×8 IMPLANT
STAPLER VISISTAT (STAPLE) ×3 IMPLANT
SUT VICRYL 0 UR6 27IN ABS (SUTURE) ×3 IMPLANT
TAPE CLOTH SURG 4X10 WHT LF (GAUZE/BANDAGES/DRESSINGS) ×2 IMPLANT
TROCAR ENDO BLADELESS 11MM (ENDOMECHANICALS) ×3 IMPLANT
TROCAR XCEL NON-BLD 5MMX100MML (ENDOMECHANICALS) ×3 IMPLANT
TROCAR XCEL UNIV SLVE 11M 100M (ENDOMECHANICALS) ×3 IMPLANT
TUBING INSUFFLATION (TUBING) ×3 IMPLANT
WARMER LAPAROSCOPE (MISCELLANEOUS) ×3 IMPLANT
YANKAUER SUCT 12FT TUBE ARGYLE (SUCTIONS) ×3 IMPLANT

## 2013-12-06 NOTE — Anesthesia Procedure Notes (Signed)
Procedure Name: Intubation Date/Time: 12/06/2013 12:29 PM Performed by: Franco NonesYATES, Giannis Corpuz S Pre-anesthesia Checklist: Patient identified, Patient being monitored, Timeout performed, Emergency Drugs available and Suction available Patient Re-evaluated:Patient Re-evaluated prior to inductionOxygen Delivery Method: Circle System Utilized Preoxygenation: Pre-oxygenation with 100% oxygen Intubation Type: IV induction, Rapid sequence and Cricoid Pressure applied Ventilation: Mask ventilation without difficulty and Oral airway inserted - appropriate to patient size Laryngoscope Size: Hyacinth MeekerMiller and 2 Grade View: Grade I Tube type: Oral Tube size: 7.0 mm Number of attempts: 1 Airway Equipment and Method: stylet Placement Confirmation: ETT inserted through vocal cords under direct vision,  positive ETCO2 and breath sounds checked- equal and bilateral Secured at: 21 cm Tube secured with: Tape Dental Injury: Teeth and Oropharynx as per pre-operative assessment

## 2013-12-06 NOTE — Transfer of Care (Signed)
Immediate Anesthesia Transfer of Care Note  Patient: Tasha Murray  Procedure(s) Performed: Procedure(s): LAPAROSCOPIC CHOLECYSTECTOMY (N/A)  Patient Location: PACU  Anesthesia Type:General  Level of Consciousness: awake and patient cooperative  Airway & Oxygen Therapy: Patient Spontanous Breathing and non-rebreather face mask  Post-op Assessment: Report given to PACU RN, Post -op Vital signs reviewed and stable and Patient moving all extremities  Post vital signs: Reviewed and stable  Complications: No apparent anesthesia complications

## 2013-12-06 NOTE — Care Management Note (Addendum)
    Page 1 of 1   12/07/2013     9:30:49 AM CARE MANAGEMENT NOTE 12/07/2013  Patient:  Tasha Murray,Tasha Murray   Account Number:  [id number]  Date Initiated:  12/06/2013  Documentation initiated by:  Anibal HendersonBOLDEN,GENEVA  Subjective/Objective Assessment:   5 weeks PP patient admitted with abd pain. Having Lap chole today, and plans to return home tomorrow. Baby girl is home with family.     Action/Plan:   No needs anticipated-   Anticipated DC Date:  12/07/2013   Anticipated DC Plan:  HOME/SELF CARE      DC Planning Services  CM consult      Choice offered to / List presented to:             Status of service:  Completed, signed off Medicare Important Message given?   (If response is "NO", the following Medicare IM given date fields will be blank) Date Medicare IM given:   Medicare IM given by:   Date Additional Medicare IM given:   Additional Medicare IM given by:    Discharge Disposition:  HOME/SELF CARE  Per UR Regulation:  Reviewed for med. necessity/level of care/duration of stay  If discussed at Long Length of Stay Meetings, dates discussed:    Comments:   12/07/2013 0930 Tasha SheriffJessica Childress, RN, MSN, PCCN Patient being discharged home with self care. No CM needs noted.  12/06/13 1700 Anibal HendersonGeneva Bolden RN/CM

## 2013-12-06 NOTE — Anesthesia Preprocedure Evaluation (Signed)
Anesthesia Evaluation  Patient identified by MRN, date of birth, ID band Patient awake    Reviewed: Allergy & Precautions, H&P , Patient's Chart, lab work & pertinent test results  Airway Mallampati: II TM Distance: >3 FB Neck ROM: full    Dental no notable dental hx.    Pulmonary neg pulmonary ROS,  breath sounds clear to auscultation  Pulmonary exam normal       Cardiovascular Exercise Tolerance: Good negative cardio ROS  Rhythm:regular Rate:Normal     Neuro/Psych    GI/Hepatic negative GI ROS,   Endo/Other  Morbid obesity  Renal/GU      Musculoskeletal   Abdominal   Peds  Hematology   Anesthesia Other Findings   Reproductive/Obstetrics                           Anesthesia Physical Anesthesia Plan  ASA: III  Anesthesia Plan: General   Post-op Pain Management:    Induction: Intravenous, Rapid sequence and Cricoid pressure planned  Airway Management Planned: Oral ETT  Additional Equipment:   Intra-op Plan:   Post-operative Plan: Extubation in OR  Informed Consent: I have reviewed the patients History and Physical, chart, labs and discussed the procedure including the risks, benefits and alternatives for the proposed anesthesia with the patient or authorized representative who has indicated his/her understanding and acceptance.     Plan Discussed with:   Anesthesia Plan Comments:         Anesthesia Quick Evaluation

## 2013-12-06 NOTE — Op Note (Signed)
Patient:  Tasha Murray  DOB:  March 24, 1991  MRN:  [id number]   Preop Diagnosis:  Cholecystitis, cholelithiasis, history of pancreatitis  Postop Diagnosis:  Same  Procedure:  Laparoscopic cholecystectomy  Surgeon:  Franky MachoMark Sula Fetterly, M.D.  Anes:  General endotracheal  Indications:  Patient is a 23 year old white female who presented with cholecystitis, cholelithiasis, and pancreatitis. The patient now presents to the operating room for laparoscopic cholecystectomy. The risks and benefits of the procedure including bleeding, infection, hepatobiliary injury, the possibility of an open procedure were fully explained to the patient, who gave informed consent.  Procedure note:  The patient is placed the supine position. After induction of general endotracheal anesthesia, the abdomen was prepped and draped using usual sterile technique with DuraPrep. Surgical site confirmation was performed.  A supraumbilical incision was made down to the fascia. A Veress needle was introduced into the abdominal cavity and confirmation of placement was done using the saline drop test. The abdomen was then insufflated to 16 mm mercury pressure. An 11 mm trocar was introduced into the abdominal cavity under direct visualization the difficulty. Patient was placed in reverse Trendelenburg position and additional 11 mm trocar was placed the epigastric region 5 mm trochars were placed were upper quadrant and right flank regions. Liver was inspected and noted within normal limits. The gallbladder was retracted in a dynamic fashion for to expose the triangle of Calot. The cystic duct was first identified. Its juncture to the infundibulum was fully identified. Endoclips were placed proximally and distally on the cystic duct, and the cystic duct was divided. This was likewise done cystic artery. The gallbladder was then freed away from the gallbladder fossa using Bovie electrocautery. The gallbladder was delivered through the  epigastric trocar site using an Endo Catch bag. The gallbladder fossa was inspected and no abnormal bleeding or bile leakage was noted. Surgicel is placed the gallbladder fossa. All fluid and air were then evacuated from the abdominal cavity prior to removal of the trochars.  All wounds were irrigated with normal saline. All wounds were injected with 0.5% Sensorcaine. The supraumbilical fascia was reapproximated using an 0 Vicryl interrupted suture. All skin incisions were closed using staples. Betadine ointment and dry sterile dressings were applied.  All tape and needle counts were correct at the end of the procedure. Patient was extubated in the operating room and transferred to PACU in stable condition.  Complications:  None  EBL:  Minimal  Specimen:  Gallbladder

## 2013-12-06 NOTE — Anesthesia Postprocedure Evaluation (Signed)
  Anesthesia Post-op Note  Patient: Tasha Murray  Procedure(s) Performed: Procedure(s): LAPAROSCOPIC CHOLECYSTECTOMY (N/A)  Patient Location: PACU  Anesthesia Type:General  Level of Consciousness: awake and patient cooperative  Airway and Oxygen Therapy: Patient Spontanous Breathing and non-rebreather face mask  Post-op Pain: none  Post-op Assessment: Post-op Vital signs reviewed, Patient's Cardiovascular Status Stable, Respiratory Function Stable, Patent Airway and Pain level controlled  Post-op Vital Signs: Reviewed and stable  Last Vitals:  Filed Vitals:   12/06/13 1317  BP: 124/69  Pulse: 88  Temp: 36.8 C  Resp: 24    Complications: No apparent anesthesia complications

## 2013-12-07 ENCOUNTER — Ambulatory Visit: Payer: Medicaid Other | Admitting: Advanced Practice Midwife

## 2013-12-07 LAB — BASIC METABOLIC PANEL
ANION GAP: 9 (ref 5–15)
BUN: 7 mg/dL (ref 6–23)
CALCIUM: 8.7 mg/dL (ref 8.4–10.5)
CO2: 27 mEq/L (ref 19–32)
CREATININE: 0.89 mg/dL (ref 0.50–1.10)
Chloride: 103 mEq/L (ref 96–112)
GFR calc Af Amer: >90 mL/min (ref >90)
GFR calc non Af Amer: >90 mL/min (ref >90)
Glucose, Bld: 89 mg/dL (ref 70–99)
Potassium: 3.9 mEq/L (ref 3.7–5.3)
SODIUM: 139 meq/L (ref 137–147)

## 2013-12-07 LAB — CBC
HCT: 37.2 % (ref 36.0–46.0)
Hemoglobin: 12.3 g/dL (ref 12.0–15.0)
MCH: 28 pg (ref 26.0–34.0)
MCHC: 33.1 g/dL (ref 30.0–36.0)
MCV: 84.7 fL (ref 78.0–100.0)
PLATELETS: 206 10*3/uL (ref 150–400)
RBC: 4.39 MIL/uL (ref 3.87–5.11)
RDW: 14.9 % (ref 11.5–15.5)
WBC: 9.9 10*3/uL (ref 4.0–10.5)

## 2013-12-07 MED ORDER — OXYCODONE-ACETAMINOPHEN 5-325 MG PO TABS: 1.0000 | ORAL_TABLET | ORAL | Status: DC | PRN

## 2013-12-07 NOTE — Discharge Instructions (Signed)

## 2013-12-07 NOTE — Discharge Summary (Signed)
Physician Discharge Summary  Patient ID: Tasha Murray MRN: [id number] DOB/AGE: [birth year] 23 y.o.  Admit date: 12/04/2013 Discharge date: 12/07/2013  Admission Diagnoses: Cholecystitis, cholelithiasis  Discharge Diagnoses: Same Active Problems:   Cholecystitis with cholelithiasis   Discharged Condition: good  Hospital Course: Patient is a 23 year old black female postpartum patient with a complicated postoperative cesarean wound infection who presented with right upper quadrant abdominal pain, nausea, vomiting. She was found to have acute cholecystitis with cholelithiasis. She subsequently underwent laparoscopic cholecystectomy on 12/06/2013. She tolerated the procedure well. Her postoperative course has been unremarkable. Her diet was advanced without difficulty. Patient is being discharged home on postoperative day one in good improving condition.  Treatments: surgery: Laparoscopic cholecystectomy on 12/06/2013  Discharge Exam: Blood pressure 98/61, pulse 61, temperature 98.2 F (36.8 C), temperature source Oral, resp. rate 20, height 5\' 4"  (1.626 m), weight 132.45 kg (292 lb), SpO2 99.00%, currently breastfeeding. General appearance: alert, cooperative and no distress Resp: clear to auscultation bilaterally Cardio: regular rate and rhythm, S1, S2 normal, no murmur, click, rub or gallop GI: Soft. Wounds healing well.  Disposition: 01-Home or Self Care     Medication List         acyclovir 400 MG tablet  Commonly known as:  ZOVIRAX  Take 1 tablet (400 mg total) by mouth 3 (three) times daily.     ondansetron 4 MG disintegrating tablet  Commonly known as:  ZOFRAN ODT  4mg  ODT q4 hours prn nausea/vomit     oxyCODONE-acetaminophen 5-325 MG per tablet  Commonly known as:  PERCOCET/ROXICET  Take 1-2 tablets by mouth every 4 (four) hours as needed for severe pain (moderate - severe pain).     prenatal vitamin w/FE, FA 27-1 MG Tabs tablet  Take 1 tab daily            Follow-up Information   Follow up with Dalia HeadingJENKINS,[name], MD. Schedule an appointment as soon as possible for a visit on 12/19/2013.   Specialty:  General Surgery   Contact information:   [address] [place] [id number] [phone]       Signed: Franky MachoJENKINS,Deanthony Maull A 12/07/2013, 8:29 AM

## 2013-12-07 NOTE — Progress Notes (Signed)
Patient discharged home with instructions given on medications,and follow up visits,patient verbalized understanding.Prescription sent with patient.Vital signs stable.No c/o pain or discomfort noted. Accompanied by staff to an awaiting vehicle.

## 2013-12-08 ENCOUNTER — Encounter (HOSPITAL_COMMUNITY): Payer: Self-pay | Admitting: General Surgery

## 2013-12-12 ENCOUNTER — Encounter: Payer: Self-pay | Admitting: Advanced Practice Midwife

## 2013-12-12 ENCOUNTER — Ambulatory Visit (INDEPENDENT_AMBULATORY_CARE_PROVIDER_SITE_OTHER): Payer: Medicaid Other | Admitting: Advanced Practice Midwife

## 2013-12-12 NOTE — Progress Notes (Signed)
  Tasha Murray is a 23 y.o. who presents for a postpartum visit. She is 6 weeks postpartum following a low cervical transverse Cesarean section with a T incision for NRFHT. I have fully reviewed the prenatal and intrapartum course. The delivery was at 40.6 gestational weeks. Anesthesia: spinal. Postpartum course has been complicated by mild wound infection and acute cholecystitis for which she underwent a cholecystectomy 12/04/13. Baby's course has been uneventful. Baby is feeding by breast (she took some time off while on antibiotics). Bleeding: staining only. Bowel function is normal. Bladder function is normal. Patient is not sexually active. Contraception method is Nexplanon. Postpartum depression screening: negative.    Review of Systems   Constitutional: Negative for fever and chills Eyes: Negative for visual disturbances Respiratory: Negative for shortness of breath, dyspnea Cardiovascular: Negative for chest pain or palpitations  Gastrointestinal: Negative for vomiting, diarrhea and constipation Genitourinary: Negative for dysuria and urgency Musculoskeletal: Negative for back pain, joint pain, myalgias  Neurological: Negative for dizziness and headaches   Objective:     Filed Vitals:   12/12/13 1400  BP: 118/80   General:  alert, cooperative and no distress   Breasts:  negative  Lungs: clear to auscultation bilaterally  Heart:  regular rate and rhythm  Abdomen: Soft, nontender.  CS and GB wounds healing well   Vulva:  normal  Vagina: normal vagina  Cervix:  closed  Corpus: Well involuted     Rectal Exam: no hemorrhoids        Assessment:    normal postpartum exam Post op GB.  Plan:    1. Contraception: Nexplanon 2. Follow up in: 3 weeks for Nexplanon (no sex until) or as needed.

## 2014-01-02 ENCOUNTER — Encounter: Payer: Self-pay | Admitting: Advanced Practice Midwife

## 2014-01-02 ENCOUNTER — Ambulatory Visit (INDEPENDENT_AMBULATORY_CARE_PROVIDER_SITE_OTHER): Payer: Medicaid Other | Admitting: Advanced Practice Midwife

## 2014-01-02 VITALS — BP 122/64 | Ht <= 58 in | Wt 291.0 lb

## 2014-01-02 DIAGNOSIS — Z3202 Encounter for pregnancy test, result negative: Secondary | ICD-10-CM

## 2014-01-02 DIAGNOSIS — Z30017 Encounter for initial prescription of implantable subdermal contraceptive: Secondary | ICD-10-CM | POA: Insufficient documentation

## 2014-01-02 LAB — POCT URINE PREGNANCY: Preg Test, Ur: NEGATIVE

## 2014-01-02 NOTE — Progress Notes (Signed)
  HPI:  Tasha Murray is a 23 y.o. year old African American female here for Nexplanon insertion. She has not had intercourse since having her baby, and her pregnancy test today was negative.  Risks/benefits/side effects of Nexplanon have been discussed and her questions have been answered.  Specifically, a failure rate of 05/998 has been reported, with an increased failure rate if pt takes St. John's Wort and/or antiseizure medicaitons.  HILARIE SINHA is aware of the common side effect of irregular bleeding, which the incidence of decreases over time.   Past Medical History: Past Medical History  Diagnosis Date  . Medical history non-contributory   . History of gonorrhea     Past Surgical History: Past Surgical History  Procedure Laterality Date  . No past surgeries    . Cesarean section N/A 10/29/2013    Procedure: CESAREAN SECTION-Low transverse with vertical T;  Surgeon: Allie Bossier, MD;  Location: [facility];  Service: Obstetrics;  Laterality: N/A;  . Cholecystectomy N/A 12/06/2013    Procedure: LAPAROSCOPIC CHOLECYSTECTOMY;  Surgeon: Dalia Heading, MD;  Location: [facility];  Service: General;  Laterality: N/A;    Family History: Family History  Problem Relation Age of Onset  . Heart disease Mother   . Arthritis Mother   . Asthma Mother   . Heart disease Maternal Grandmother   . Diabetes Maternal Grandmother     Social History: History  Substance Use Topics  . Smoking status: Never Smoker   . Smokeless tobacco: Never Used  . Alcohol Use: No    Allergies: No Known Allergies    Her left arm, approximatly 4 inches proximal from the elbow, was cleansed with alcohol and anesthetized with 2cc of 2% Lidocaine.  The area was cleansed again and the Nexplanon was inserted without difficulty.  A pressure bandage was applied.  Pt was instructed to remove pressure bandage in a few hours, and keep insertion site covered with a bandaid for 3 days.  Back up contraception was  recommended for 2 weeks.  Follow-up scheduled PRN problems  CRESENZO-DISHMAN,Andromeda Poppen 01/02/2014 2:16 PM

## 2014-03-12 ENCOUNTER — Encounter: Payer: Self-pay | Admitting: Advanced Practice Midwife

## 2015-05-18 IMAGING — US US ABDOMEN COMPLETE
1 series · 13 of 25 positions shown · non-contrast
Comparison: None.

CLINICAL DATA: Abdominal pain with elevated liver enzymes

EXAM:
ULTRASOUND ABDOMEN COMPLETE

[Series 1: us abdomen complete · 0.24mm/px · 13 of 121 slices shown]
[im 1/121]
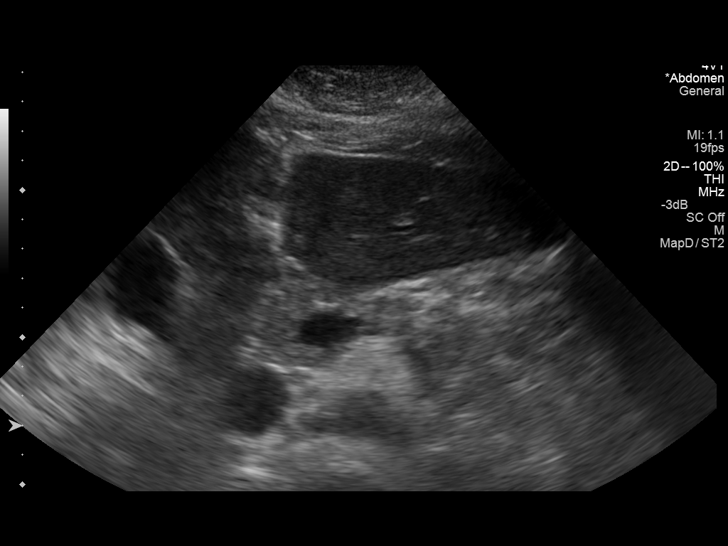
[im 11/121]
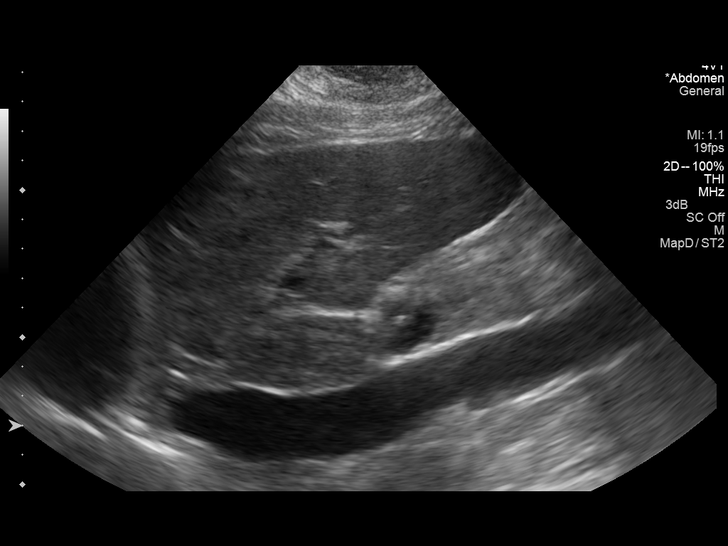
[im 21/121]
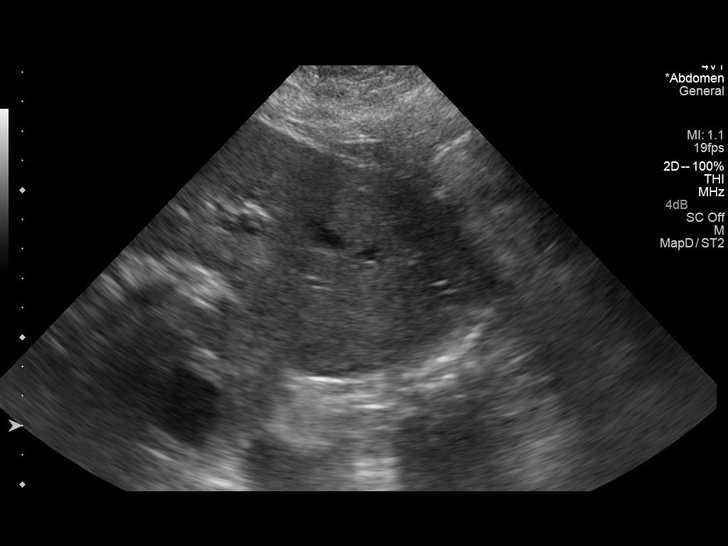
[im 31/121]
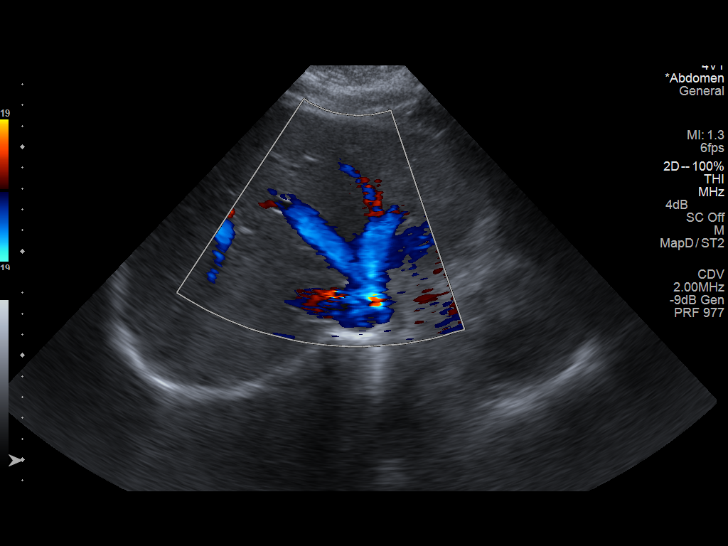
[im 41/121]
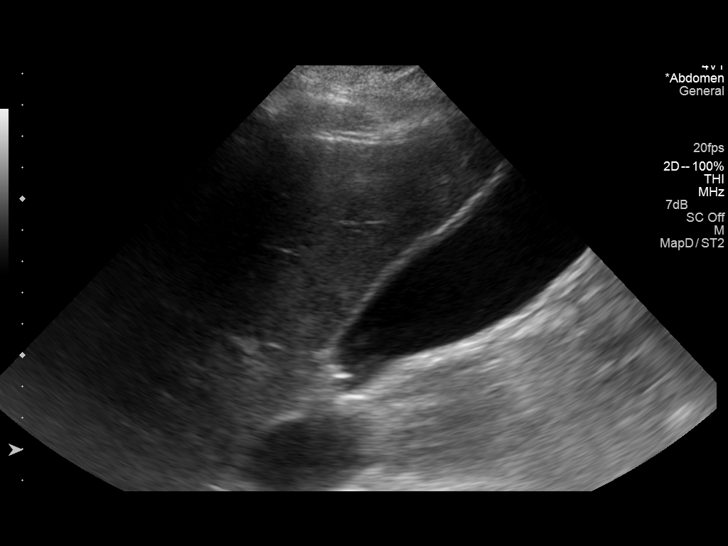
[im 51/121]
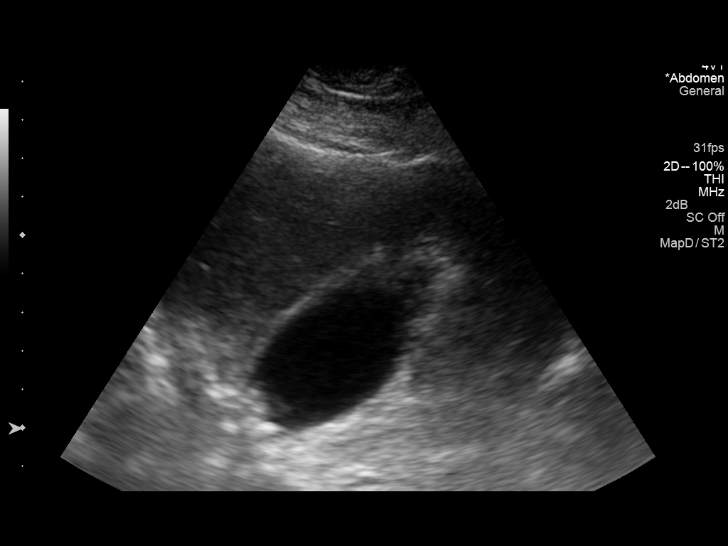
[im 61/121]
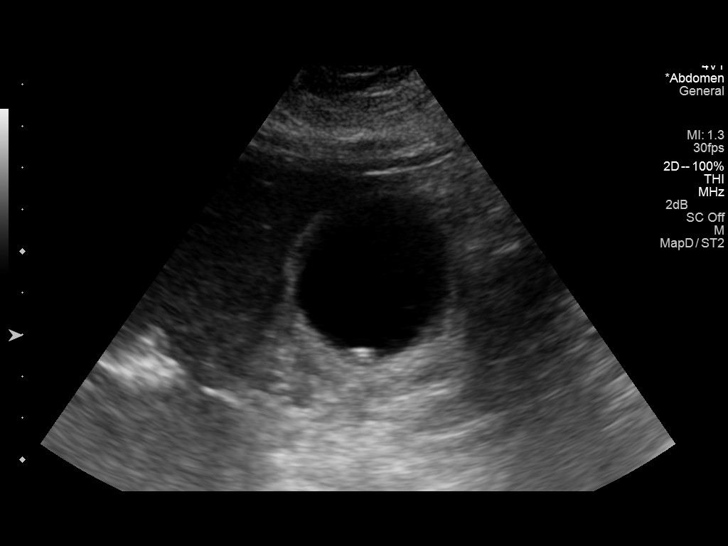
[im 71/121]
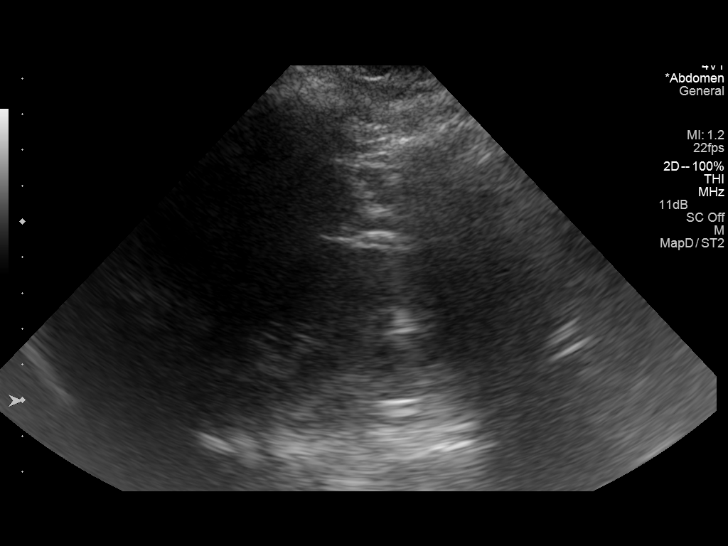
[im 81/121]
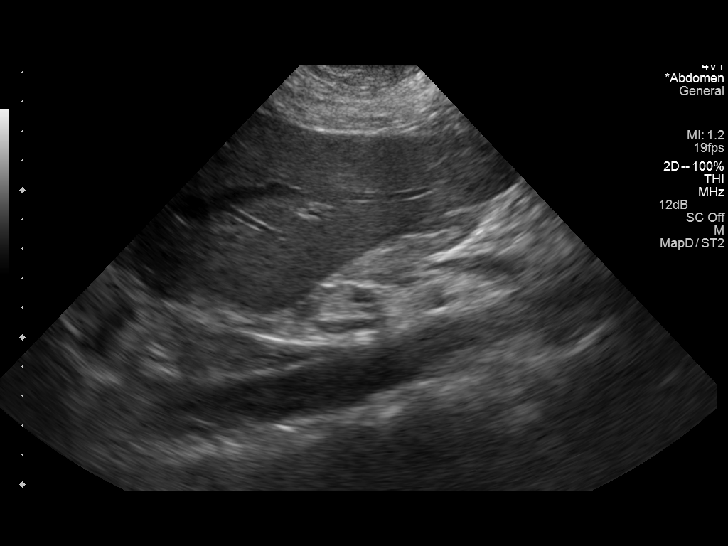
[im 91/121]
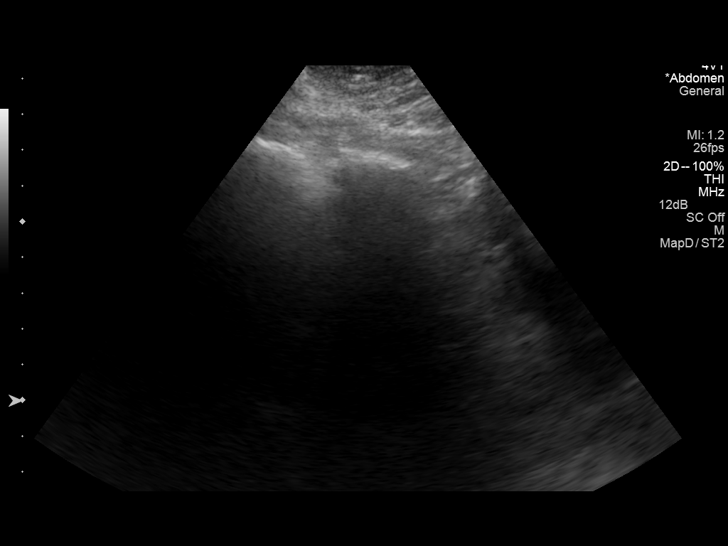
[im 101/121]
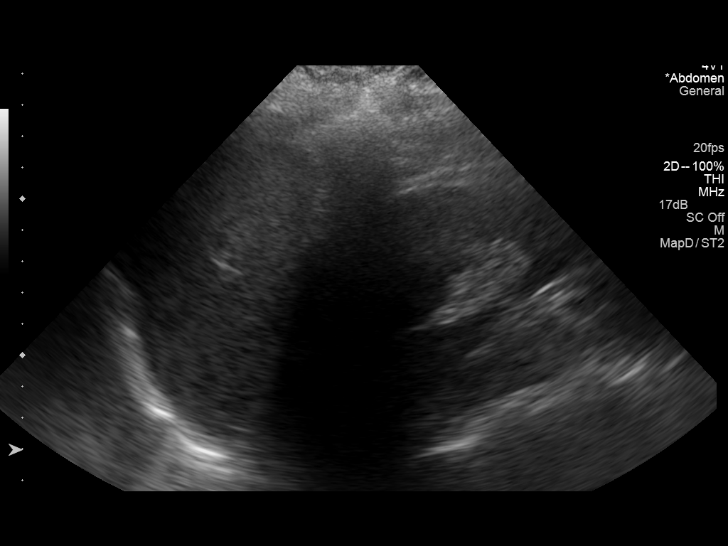
[im 111/121]
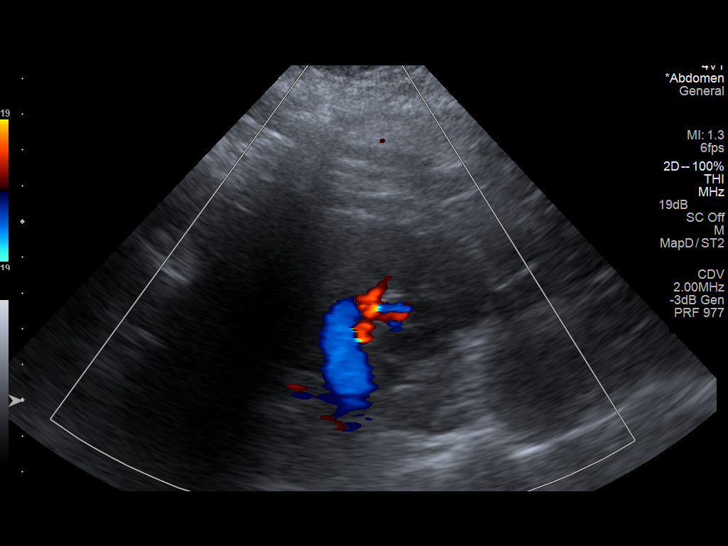
[im 121/121]
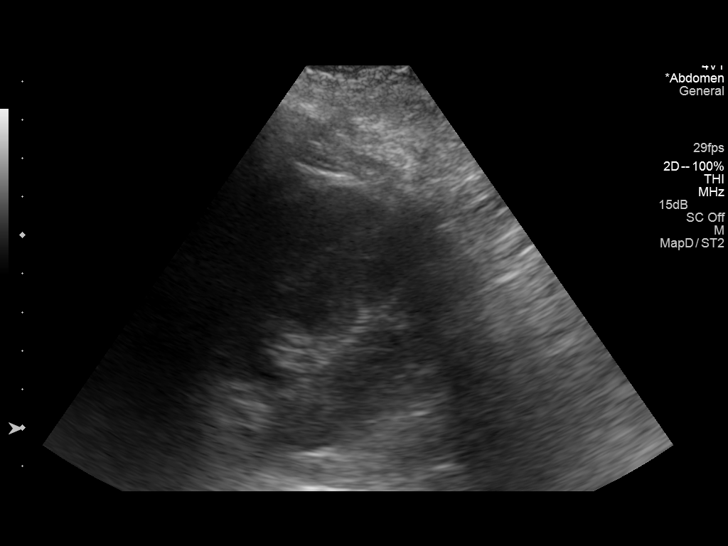

[13 of 25 positions shown; findings below may reference images not displayed]

FINDINGS: Gallbladder:

There are multiple small echogenic foci in the gallbladder which
move and shadow consistent with small gallstones. There is no
pericholecystic fluid. The gallbladder wall is thickened and appears
subtly edematous, however. No sonographic Murphy sign noted.

Common bile duct:

Diameter: 5 mm. There is no appreciable intrahepatic, common
hepatic, or common bile duct dilatation.

Liver:

No focal lesion identified. Within normal limits in parenchymal
echogenicity. Liver measures 17.1 cm in length, prominent.

IVC:

No abnormality visualized.

Pancreas:

No mass or inflammatory focus.

Spleen:

Size and appearance within normal limits.

Right Kidney:

Length: 10.2 cm. Echogenicity within normal limits. No mass or
hydronephrosis visualized.

Left Kidney:

Length: 10.8 cm. Echogenicity within normal limits. No mass or
hydronephrosis visualized.

Abdominal aorta:

No aneurysm visualized.

Other findings:

No demonstrable ascites.
IMPRESSION: Cholelithiasis with mild gallbladder wall thickening. Question early
cholecystitis.

Liver is prominent but otherwise unremarkable in appearance.

Study otherwise unremarkable.

## 2016-03-10 ENCOUNTER — Emergency Department (HOSPITAL_COMMUNITY)
Admission: EM | Admit: 2016-03-10 | Discharge: 2016-03-10 | Disposition: A | Discharge status: Home / Self Care | Payer: Medicaid Other | Attending: Emergency Medicine | Admitting: Emergency Medicine

## 2016-03-10 ENCOUNTER — Encounter (HOSPITAL_COMMUNITY): Payer: Self-pay | Admitting: *Deleted

## 2016-03-10 DIAGNOSIS — T63481A Toxic effect of venom of other arthropod, accidental (unintentional), initial encounter: Secondary | ICD-10-CM

## 2016-03-10 DIAGNOSIS — T63441A Toxic effect of venom of bees, accidental (unintentional), initial encounter: Secondary | ICD-10-CM | POA: Insufficient documentation

## 2016-03-10 MED ORDER — FAMOTIDINE 20 MG PO TABS
20.0000 mg | ORAL_TABLET | Freq: Once | ORAL | Status: AC
  Administered 2016-03-10: 20 mg via ORAL
  Filled 2016-03-10: qty 1

## 2016-03-10 MED ORDER — DIPHENHYDRAMINE HCL 25 MG PO CAPS
50.0000 mg | ORAL_CAPSULE | Freq: Once | ORAL | Status: AC
  Administered 2016-03-10: 50 mg via ORAL
  Filled 2016-03-10: qty 2

## 2016-03-10 MED ORDER — SULFAMETHOXAZOLE-TRIMETHOPRIM 800-160 MG PO TABS
1.0000 | ORAL_TABLET | Freq: Once | ORAL | Status: AC
  Administered 2016-03-10: 1 via ORAL
  Filled 2016-03-10: qty 1

## 2016-03-10 MED ORDER — SULFAMETHOXAZOLE-TRIMETHOPRIM 800-160 MG PO TABS: 1.0000 | ORAL_TABLET | Freq: Two times a day (BID) | ORAL | 0 refills | Status: DC

## 2016-03-10 MED ORDER — FAMOTIDINE 20 MG PO TABS
20.0000 mg | ORAL_TABLET | Freq: Two times a day (BID) | ORAL | 0 refills | Status: DC
Start: 2016-03-10 — End: 2019-04-19

## 2016-03-10 NOTE — ED Provider Notes (Signed)
[facility] Provider Note   CSN: [id number] Arrival date & time: 03/10/16  0016   By signing my name below, I, Clarisse GougeXavier Herndon, attest that this documentation has been prepared under the direction and in the presence of Devoria AlbeIva Byrl Latin, MD. Electronically signed, Clarisse GougeXavier Herndon, ED Scribe. 03/10/16. 1:18 AM.   Time seen 12:40 AM  History   Chief Complaint Chief Complaint  Patient presents with  . Insect Bite   The history is provided by the patient. No language interpreter was used.   HPI Comments: Tasha Murray is a 25 y.o. female who presents to the Emergency Department complaining of pruritic area on her left calf. Pt reports she was sitting outside her house on the porch on October 27. She was not aware of any injury or bite. The following day she woke up with itching of her left calf. She reports she has been scratching her calf a lot. She did try calamine lotion without improvement in 2 nights ago put ice on it. She denies pain, fever, chills, nausea, or vomiting. She denies that the area is draining.   PCP none  Past Medical History:  Diagnosis Date  . History of gonorrhea   . Medical history non-contributory     Patient Active Problem List   Diagnosis Date Noted  . Insertion of Nexplanon 01/02/2014  . Postpartum care following cesarean delivery 12/12/2013  . Cholecystitis with cholelithiasis 12/04/2013  . Severe obesity (BMI >= 40) (HCC) 05/31/2013    Past Surgical History:  Procedure Laterality Date  . CESAREAN SECTION N/A 10/29/2013   Procedure: CESAREAN SECTION-Low transverse with vertical T;  Surgeon: Allie BossierMyra C Dove, MD;  Location: [facility];  Service: Obstetrics;  Laterality: N/A;  . CHOLECYSTECTOMY N/A 12/06/2013   Procedure: LAPAROSCOPIC CHOLECYSTECTOMY;  Surgeon: Dalia HeadingMark A [name], MD;  Location: [facility];  Service: General;  Laterality: N/A;  . NO PAST SURGERIES      OB History    Gravida Para Term Preterm AB Living   1 1 1     1    SAB TAB Ectopic Multiple  Live Births           1       Home Medications    Prior to Admission medications   Medication Sig Start Date End Date Taking? Authorizing Provider  famotidine (PEPCID) 20 MG tablet Take 1 tablet (20 mg total) by mouth 2 (two) times daily. 03/10/16   Devoria AlbeIva Yusif Gnau, MD  sulfamethoxazole-trimethoprim (BACTRIM DS,SEPTRA DS) 800-160 MG tablet Take 1 tablet by mouth 2 (two) times daily. 03/10/16   Devoria AlbeIva Jame Seelig, MD    Family History Family History  Problem Relation Age of Onset  . Heart disease Mother   . Arthritis Mother   . Asthma Mother   . Heart disease Maternal Grandmother   . Diabetes Maternal Grandmother     Social History Social History  Substance Use Topics  . Smoking status: Never Smoker  . Smokeless tobacco: Never Used  . Alcohol use No  employed   Allergies   Review of patient's allergies indicates no known allergies.   Review of Systems Review of Systems  All other systems reviewed and are negative.    Physical Exam Updated Vital Signs BP 126/72 (BP Location: Left Arm)   Pulse 109   Temp 98.6 F (37 C) (Oral)   Resp 17   Ht 4\' 11"  (1.499 m)   Wt 290 lb (131.5 kg)   SpO2 98%   BMI 58.57 kg/m  Vital signs normal    Physical Exam  Constitutional: She is oriented to person, place, and time. She appears well-developed and well-nourished.  Non-toxic appearance. She does not appear ill. No distress.  obese  HENT:  Head: Normocephalic and atraumatic.  Right Ear: External ear normal.  Left Ear: External ear normal.  Nose: Nose normal. No mucosal edema or rhinorrhea.  Mouth/Throat: Mucous membranes are normal. No dental abscesses or uvula swelling.  Eyes: Conjunctivae and EOM are normal.  Neck: Normal range of motion and full passive range of motion without pain.  Cardiovascular: Normal rate.   Pulmonary/Chest: Effort normal. No respiratory distress. She has no rhonchi. She exhibits no crepitus.  Abdominal: Normal appearance.  Musculoskeletal: Normal  range of motion. She exhibits no edema or tenderness.  Moves all extremities well.   Neurological: She is alert and oriented to person, place, and time. She has normal strength. No cranial nerve deficit.  Skin: Skin is warm, dry and intact. No rash noted. No erythema. No pallor.  13 cm by 15 cm area of redness and warmth with bruising to posterior calf. No subcutaneous swelling to suggest an abscess.  Psychiatric: She has a normal mood and affect. Her speech is normal and behavior is normal. Her mood appears not anxious.  Nursing note and vitals reviewed.  Left posterior calf     ED Treatments / Results  DIAGNOSTIC STUDIES:  Procedures Procedures (including critical care time)  Medications Ordered in ED Medications  famotidine (PEPCID) tablet 20 mg (not administered)  diphenhydrAMINE (BENADRYL) capsule 50 mg (not administered)  sulfamethoxazole-trimethoprim (BACTRIM DS,SEPTRA DS) 800-160 MG per tablet 1 tablet (not administered)     Initial Impression / Assessment and Plan / ED Course  I have reviewed the triage vital signs and the nursing notes.  Pertinent labs & imaging results that were available during my care of the patient were reviewed by me and considered in my medical decision making (see chart for details).  Clinical Course    COORDINATION OF CARE: 12:40 AM Discussed treatment plan with pt at bedside and pt agreed to plan.   Patient has an area that appears to be bruising and she mainly is having itching from at presumed insect bite. When I palpate her legs there is no induration to suggest an abscess. She has no pain. Patient wants antibiotics.   Final Clinical Impressions(s) / ED Diagnoses   Final diagnoses:  Local reaction to insect sting, accidental or unintentional, initial encounter    New Prescriptions New Prescriptions   FAMOTIDINE (PEPCID) 20 MG TABLET    Take 1 tablet (20 mg total) by mouth 2 (two) times daily.   SULFAMETHOXAZOLE-TRIMETHOPRIM  (BACTRIM DS,SEPTRA DS) 800-160 MG TABLET    Take 1 tablet by mouth 2 (two) times daily.  OTC benadryl, cort aid, zyrtec  Plan discharge  Devoria AlbeIva Malory Spurr, MD, FACEP    I personally performed the services described in this documentation, which was scribed in my presence. The recorded information has been reviewed and considered.  Devoria AlbeIva Hena Ewalt, MD, Concha PyoFACEP    Tramond Slinker, MD 03/10/16 [phone]

## 2016-03-10 NOTE — Discharge Instructions (Signed)
Continue using ice packs, heat will make the area itch more. You can take pepcid twice a day for the itching with benadryl 50 mg every 6 hrs (you could also try zyrtec 10 mg once day which is less sedating and use the benadryl at night for itching not controlled by the zyrtec).  You can put cort-aid OTC on the area for the itching also. Recheck if you get a fever, chills, the area drains, your get pain in the area.

## 2016-03-10 NOTE — ED Triage Notes (Signed)
Pt c/o insect bite to left lower leg; pt states she thinks the bite occurred either Friday or Saturday night; pt states it is very itchy now with some swelling; pt has been using calamine lotion with little relief

## 2017-08-11 ENCOUNTER — Encounter: Payer: Self-pay | Admitting: Advanced Practice Midwife

## 2017-08-11 ENCOUNTER — Ambulatory Visit: Payer: BLUE CROSS/BLUE SHIELD | Admitting: Advanced Practice Midwife

## 2017-08-11 VITALS — BP 120/80 | HR 99 | Wt 310.0 lb

## 2017-08-11 DIAGNOSIS — Z30017 Encounter for initial prescription of implantable subdermal contraceptive: Secondary | ICD-10-CM

## 2017-08-11 DIAGNOSIS — Z3202 Encounter for pregnancy test, result negative: Secondary | ICD-10-CM | POA: Diagnosis not present

## 2017-08-11 DIAGNOSIS — Z3046 Encounter for surveillance of implantable subdermal contraceptive: Secondary | ICD-10-CM

## 2017-08-11 DIAGNOSIS — Z3049 Encounter for surveillance of other contraceptives: Secondary | ICD-10-CM | POA: Diagnosis not present

## 2017-08-11 LAB — POCT URINE PREGNANCY: PREG TEST UR: NEGATIVE

## 2017-08-11 MED ORDER — ETONOGESTREL 68 MG ~~LOC~~ IMPL
68.0000 mg | DRUG_IMPLANT | Freq: Once | SUBCUTANEOUS | Status: AC
  Administered 2017-08-11: 68 mg via SUBCUTANEOUS

## 2017-08-11 NOTE — Progress Notes (Signed)
Tasha FickKiri S Dredge 27 y.o. Vitals:   08/11/17 1035  BP: 120/80  Pulse: 99    Past Medical History:  Diagnosis Date  . History of gonorrhea   . Medical history non-contributory     Family History  Problem Relation Age of Onset  . Heart disease Mother   . Arthritis Mother   . Asthma Mother   . Heart disease Maternal Grandmother   . Diabetes Maternal Grandmother     Social History   Socioeconomic History  . Marital status: Single    Spouse name: Not on file  . Number of children: Not on file  . Years of education: Not on file  . Highest education level: Not on file  Occupational History  . Not on file  Social Needs  . Financial resource strain: Not on file  . Food insecurity:    Worry: Not on file    Inability: Not on file  . Transportation needs:    Medical: Not on file    Non-medical: Not on file  Tobacco Use  . Smoking status: Never Smoker  . Smokeless tobacco: Never Used  Substance and Sexual Activity  . Alcohol use: No  . Drug use: No  . Sexual activity: Yes    Birth control/protection: Implant  Lifestyle  . Physical activity:    Days per week: Not on file    Minutes per session: Not on file  . Stress: Not on file  Relationships  . Social connections:    Talks on phone: Not on file    Gets together: Not on file    Attends religious service: Not on file    Active member of club or organization: Not on file    Attends meetings of clubs or organizations: Not on file    Relationship status: Not on file  . Intimate partner violence:    Fear of current or ex partner: Not on file    Emotionally abused: Not on file    Physically abused: Not on file    Forced sexual activity: Not on file  Other Topics Concern  . Not on file  Social History Narrative  . Not on file    HPI:  Tasha Murray is here for Nexplanon removal and reinsertion.She is 6 months late, still having irregular bleeding.Not having sex.   She was given informed consent for removal of her  Nexplanon and reinsertion of a new one.A signed copy is in the chart.  Appropriate time out taken. Nexlanon site (left arm) identified and thea area was prepped in usual sterile fashon. 2 cc of 1% lidocaine was used to anesthetize the area starting with the distal end of the implant. A small stab incision was made right beside the implant on the distal portion.  The Nexplanon rod was grasped using hemostats and removed without difficulty.  There was less than 3 cc blood loss. There were no complications. Next, the area was cleansed again and the new Nexplanon was inserted without difficulty.  Steri strips and a pressure bandage were applied.  Pt was instructed to remove pressure bandage in a few hours, and keep insertion site covered with a bandaid for 3 days.  Follow-up scheduled PRN problems  CRESENZO-DISHMAN,Triston Lisanti 08/11/2017 11:13 AM

## 2019-04-19 ENCOUNTER — Other Ambulatory Visit: Payer: Self-pay

## 2019-04-19 ENCOUNTER — Ambulatory Visit
Admission: EM | Admit: 2019-04-19 | Discharge: 2019-04-19 | Disposition: A | Discharge status: Home / Self Care | Payer: BC Managed Care – PPO | Attending: Urgent Care | Admitting: Urgent Care

## 2019-04-19 DIAGNOSIS — B349 Viral infection, unspecified: Secondary | ICD-10-CM | POA: Diagnosis not present

## 2019-04-19 DIAGNOSIS — R05 Cough: Secondary | ICD-10-CM

## 2019-04-19 DIAGNOSIS — R0789 Other chest pain: Secondary | ICD-10-CM

## 2019-04-19 DIAGNOSIS — R059 Cough, unspecified: Secondary | ICD-10-CM

## 2019-04-19 MED ORDER — BENZONATATE 100 MG PO CAPS: 100.0000 mg | ORAL_CAPSULE | Freq: Three times a day (TID) | ORAL | 0 refills | Status: DC | PRN

## 2019-04-19 MED ORDER — PROMETHAZINE-DM 6.25-15 MG/5ML PO SYRP: 5.0000 mL | ORAL_SOLUTION | Freq: Every evening | ORAL | 0 refills | Status: DC | PRN

## 2019-04-19 NOTE — ED Provider Notes (Signed)
[facility]     MRN: [id number] DOB: 12-27-1990  Subjective:   Tasha Murray is a 28 y.o. female presenting for 4-day history of acute onset moderate persistent productive cough that is now eliciting intermittent mild chest pains.  Has not tried medications for relief.  Denies history of asthma, smoking.  Denies taking chronic medications.  No Known Allergies  Past Medical History:  Diagnosis Date  . History of gonorrhea   . Medical history non-contributory      Past Surgical History:  Procedure Laterality Date  . CESAREAN SECTION N/A 10/29/2013   Procedure: CESAREAN SECTION-Low transverse with vertical T;  Surgeon: Allie Bossier, MD;  Location: [facility];  Service: Obstetrics;  Laterality: N/A;  . CHOLECYSTECTOMY N/A 12/06/2013   Procedure: LAPAROSCOPIC CHOLECYSTECTOMY;  Surgeon: Dalia Heading, MD;  Location: [facility];  Service: General;  Laterality: N/A;  . NO PAST SURGERIES      Family History  Problem Relation Age of Onset  . Heart disease Mother   . Arthritis Mother   . Asthma Mother   . Heart disease Maternal Grandmother   . Diabetes Maternal Grandmother     Social History   Tobacco Use  . Smoking status: Never Smoker  . Smokeless tobacco: Never Used  Substance Use Topics  . Alcohol use: No  . Drug use: No    Review of Systems  Constitutional: Positive for malaise/fatigue. Negative for fever.  HENT: Negative for congestion, ear pain, sinus pain and sore throat.   Eyes: Negative for discharge and redness.  Respiratory: Positive for cough. Negative for hemoptysis, shortness of breath and wheezing.   Cardiovascular: Positive for chest pain.  Gastrointestinal: Negative for abdominal pain, diarrhea, nausea and vomiting.  Genitourinary: Negative for dysuria, flank pain and hematuria.  Musculoskeletal: Negative for myalgias.  Skin: Negative for rash.  Neurological: Negative for dizziness, weakness and headaches.  Psychiatric/Behavioral: Negative  for depression and substance abuse.     Objective:   Vitals: BP 112/76 (BP Location: Right Arm)   Pulse 88   Temp 98.5 F (36.9 C) (Oral)   Resp 20   SpO2 97%   Breastfeeding No   Physical Exam Constitutional:      General: She is not in acute distress.    Appearance: Normal appearance. She is well-developed. She is obese. She is not ill-appearing, toxic-appearing or diaphoretic.  HENT:     Head: Normocephalic and atraumatic.     Right Ear: Tympanic membrane and ear canal normal. No drainage or tenderness. No middle ear effusion. Tympanic membrane is not erythematous.     Left Ear: Tympanic membrane and ear canal normal. No drainage or tenderness.  No middle ear effusion. Tympanic membrane is not erythematous.     Nose: Nose normal. No congestion or rhinorrhea.     Mouth/Throat:     Mouth: Mucous membranes are moist. No oral lesions.     Pharynx: Oropharynx is clear. No pharyngeal swelling, oropharyngeal exudate, posterior oropharyngeal erythema or uvula swelling.     Tonsils: No tonsillar exudate or tonsillar abscesses.  Eyes:     Extraocular Movements: Extraocular movements intact.     Right eye: Normal extraocular motion.     Left eye: Normal extraocular motion.     Conjunctiva/sclera: Conjunctivae normal.     Pupils: Pupils are equal, round, and reactive to light.  Neck:     Musculoskeletal: Normal range of motion and neck supple.  Cardiovascular:     Rate and Rhythm: Normal rate  and regular rhythm.     Pulses: Normal pulses.     Heart sounds: Normal heart sounds. No murmur. No friction rub. No gallop.   Pulmonary:     Effort: Pulmonary effort is normal. No respiratory distress.     Breath sounds: Normal breath sounds. No stridor. No wheezing, rhonchi or rales.  Lymphadenopathy:     Cervical: No cervical adenopathy.  Skin:    General: Skin is warm and dry.     Findings: No rash.  Neurological:     General: No focal deficit present.     Mental Status: She is alert  and oriented to person, place, and time.  Psychiatric:        Mood and Affect: Mood normal.        Behavior: Behavior normal.        Thought Content: Thought content normal.        Judgment: Judgment normal.      Assessment and Plan :   1. Viral syndrome   2. Cough   3. Atypical chest pain     Will manage for viral illness such as viral URI, viral rhinitis, possible COVID-19. Counseled patient on nature of COVID-19 including modes of transmission, diagnostic testing, management and supportive care.  Offered symptomatic relief. COVID 19 testing is pending. Counseled patient on potential for adverse effects with medications prescribed/recommended today, ER and return-to-clinic precautions discussed, patient verbalized understanding.     Jaynee Eagles, PA-C 04/19/19 1156

## 2019-04-19 NOTE — ED Triage Notes (Signed)
Pt reports productive cough since Saturday.  Denies any fever or sob.  Reports chest hurts when coughs.

## 2019-04-19 NOTE — Discharge Instructions (Signed)
We will manage this as a viral syndrome. For sore throat or cough try using a honey-based tea. Use 3 teaspoons of honey with juice squeezed from half lemon. Place shaved pieces of ginger into 1/2-1 cup of water and warm over stove top. Then mix the ingredients and repeat every 4 hours as needed. Please take Tylenol 500mg every 6 hours. Hydrate very well with at least 2 liters of water. Eat light meals such as soups to replenish electrolytes and soft fruits, veggies. Start an antihistamine like Zyrtec, Allegra or Claritin for postnasal drainage, sinus congestion.  You can take this together with pseudoephedrine (Sudafed) at a dose of 60 mg 3 times a day or 120 mg twice daily as needed for the same kind of congestion.    

## 2019-04-21 LAB — NOVEL CORONAVIRUS, NAA: SARS-CoV-2, NAA: NOT DETECTED

## 2019-09-28 ENCOUNTER — Other Ambulatory Visit: Payer: Self-pay

## 2019-09-28 ENCOUNTER — Ambulatory Visit
Admission: EM | Admit: 2019-09-28 | Discharge: 2019-09-28 | Disposition: A | Discharge status: Home / Self Care | Payer: BC Managed Care – PPO | Attending: Emergency Medicine | Admitting: Emergency Medicine

## 2019-09-28 ENCOUNTER — Encounter: Payer: Self-pay | Admitting: Emergency Medicine

## 2019-09-28 DIAGNOSIS — S40262A Insect bite (nonvenomous) of left shoulder, initial encounter: Secondary | ICD-10-CM | POA: Diagnosis not present

## 2019-09-28 DIAGNOSIS — W57XXXA Bitten or stung by nonvenomous insect and other nonvenomous arthropods, initial encounter: Secondary | ICD-10-CM

## 2019-09-28 DIAGNOSIS — S40862A Insect bite (nonvenomous) of left upper arm, initial encounter: Secondary | ICD-10-CM | POA: Diagnosis not present

## 2019-09-28 DIAGNOSIS — L089 Local infection of the skin and subcutaneous tissue, unspecified: Secondary | ICD-10-CM

## 2019-09-28 DIAGNOSIS — L299 Pruritus, unspecified: Secondary | ICD-10-CM

## 2019-09-28 MED ORDER — DOXYCYCLINE HYCLATE 100 MG PO CAPS: 100.0000 mg | ORAL_CAPSULE | Freq: Two times a day (BID) | ORAL | 0 refills | Status: DC

## 2019-09-28 MED ORDER — PREDNISONE 20 MG PO TABS: 20.0000 mg | ORAL_TABLET | Freq: Two times a day (BID) | ORAL | 0 refills | Status: AC

## 2019-09-28 NOTE — Discharge Instructions (Signed)
Prednisone prescribed.  Take as directed and to completion for swelling and itching Doxycycline prescribed for possible infection.   Use OTC calamine, benadryl and/or allegra/ claritin/ zyrtec as needed for itching Follow up with PCP if symptoms persists Return or go to the ER if you have any new or worsening symptoms such as fever, chills, nausea, vomiting, redness, swelling, discharge, if symptoms do not improve with medications, etc..Marland Kitchen

## 2019-09-28 NOTE — ED Provider Notes (Signed)
[facility]   [id number] 09/28/19 Arrival Time: 1548  CC: Bug bite  SUBJECTIVE:  Tasha Murray is a 29 y.o. female who presents with a bug bite to RT forearm x 3 days.  Did not see what type of bug bit her.  Localizes the rash to RT foreram.  Describes it as red, swelling, increasing in size and itching.  Has tried calamine lotion without relief.  Denies aggravating factors.  Denies similar symptoms in the past.   Denies fever, chills, nausea, vomiting, discharge, SOB, chest pain, abdominal pain, changes in bowel or bladder function.    UTD on tetanus  ROS: As per HPI.  All other pertinent ROS negative.     Past Medical History:  Diagnosis Date  . History of gonorrhea   . Medical history non-contributory    Past Surgical History:  Procedure Laterality Date  . CESAREAN SECTION N/A 10/29/2013   Procedure: CESAREAN SECTION-Low transverse with vertical T;  Surgeon: Allie Bossier, MD;  Location: [facility];  Service: Obstetrics;  Laterality: N/A;  . CHOLECYSTECTOMY N/A 12/06/2013   Procedure: LAPAROSCOPIC CHOLECYSTECTOMY;  Surgeon: Dalia Heading, MD;  Location: [facility];  Service: General;  Laterality: N/A;  . NO PAST SURGERIES     No Known Allergies No current facility-administered medications on file prior to encounter.   Current Outpatient Medications on File Prior to Encounter  Medication Sig Dispense Refill  . [DISCONTINUED] famotidine (PEPCID) 20 MG tablet Take 1 tablet (20 mg total) by mouth 2 (two) times daily. (Patient not taking: Reported on 08/11/2017) 20 tablet 0   Social History   Socioeconomic History  . Marital status: Single    Spouse name: Not on file  . Number of children: Not on file  . Years of education: Not on file  . Highest education level: Not on file  Occupational History  . Not on file  Tobacco Use  . Smoking status: Never Smoker  . Smokeless tobacco: Never Used  Substance and Sexual Activity  . Alcohol use: No  . Drug use: No  . Sexual  activity: Yes    Birth control/protection: Implant  Other Topics Concern  . Not on file  Social History Narrative  . Not on file   Social Determinants of Health   Financial Resource Strain:   . Difficulty of Paying Living Expenses:   Food Insecurity:   . Worried About Programme researcher, broadcasting/film/video in the Last Year:   . Barista in the Last Year:   Transportation Needs:   . Freight forwarder (Medical):   Marland Kitchen Lack of Transportation (Non-Medical):   Physical Activity:   . Days of Exercise per Week:   . Minutes of Exercise per Session:   Stress:   . Feeling of Stress :   Social Connections:   . Frequency of Communication with Friends and Family:   . Frequency of Social Gatherings with Friends and Family:   . Attends Religious Services:   . Active Member of Clubs or Organizations:   . Attends Banker Meetings:   Marland Kitchen Marital Status:   Intimate Partner Violence:   . Fear of Current or Ex-Partner:   . Emotionally Abused:   Marland Kitchen Physically Abused:   . Sexually Abused:    Family History  Problem Relation Age of Onset  . Heart disease Mother   . Arthritis Mother   . Asthma Mother   . Heart disease Maternal Grandmother   . Diabetes Maternal Grandmother  OBJECTIVE: Vitals:   09/28/19 1559  BP: 126/82  Pulse: 94  Resp: 18  Temp: 98.3 F (36.8 C)  TempSrc: Oral  SpO2: 98%    General appearance: alert; no distress Head: NCAT Lungs: Normal respiratory effort CV: Radial pulse 2+  Extremities: no edema Skin: warm and dry; area of erythema in round distribution, apx 10 cm in diameter to anterior RT forearm, 2 or 3 papules in center, no obvious purulent drainage, bleeding, NTTP Psychological: alert and cooperative; normal mood and affect  ASSESSMENT & PLAN:  1. Nonvenomous bug bite of shoulder or upper arm, infected, left, initial encounter   2. Itching     Meds ordered this encounter  Medications  . predniSONE (DELTASONE) 20 MG tablet    Sig: Take 1  tablet (20 mg total) by mouth 2 (two) times daily with a meal for 5 days.    Dispense:  10 tablet    Refill:  0    Order Specific Question:   Supervising Provider    Answer:   Raylene Everts [[id number]]  . doxycycline (VIBRAMYCIN) 100 MG capsule    Sig: Take 1 capsule (100 mg total) by mouth 2 (two) times daily.    Dispense:  20 capsule    Refill:  0    Order Specific Question:   Supervising Provider    Answer:   Raylene Everts [[id number]]     Prednisone prescribed.  Take as directed and to completion for swelling and itching Doxycycline prescribed for possible infection.   Use OTC calamine, benadryl and/or allegra/ claritin/ zyrtec as needed for itching Follow up with PCP if symptoms persists Return or go to the ER if you have any new or worsening symptoms such as fever, chills, nausea, vomiting, redness, swelling, discharge, if symptoms do not improve with medications, etc...  Reviewed expectations re: course of current medical issues. Questions answered. Outlined signs and symptoms indicating need for more acute intervention. Patient verbalized understanding. After Visit Summary given.   Lestine Box, PA-C 09/28/19 1607

## 2019-09-28 NOTE — ED Triage Notes (Signed)
Pt here for insect bite with increased redness and itching that happened yesterday

## 2020-03-22 ENCOUNTER — Encounter: Payer: Self-pay | Admitting: Emergency Medicine

## 2020-03-22 ENCOUNTER — Other Ambulatory Visit: Payer: Self-pay

## 2020-03-22 ENCOUNTER — Ambulatory Visit
Admission: EM | Admit: 2020-03-22 | Discharge: 2020-03-22 | Disposition: A | Discharge status: Home / Self Care | Payer: BC Managed Care – PPO | Attending: Emergency Medicine | Admitting: Emergency Medicine

## 2020-03-22 DIAGNOSIS — R059 Cough, unspecified: Secondary | ICD-10-CM | POA: Diagnosis not present

## 2020-03-22 MED ORDER — BENZONATATE 100 MG PO CAPS: 100.0000 mg | ORAL_CAPSULE | Freq: Three times a day (TID) | ORAL | 0 refills | Status: DC

## 2020-03-22 NOTE — ED Provider Notes (Signed)
[facility]   [id number] 03/22/20 Arrival Time: 1552   CC: COVID symptoms  SUBJECTIVE: History from: patient.  Tasha Murray is a 29 y.o. female who presents with runny nose, congestion, and cough x 4 days ago.  Denies sick exposure to COVID, flu or strep.  Denies alleviating or aggravating factors.  Denies previous symptoms in the past.   Denies fever, chills, sore throat, SOB, wheezing, chest pain, nausea, changes in bowel or bladder habits.    ROS: As per HPI.  All other pertinent ROS negative.     Past Medical History:  Diagnosis Date  . History of gonorrhea   . Medical history non-contributory    Past Surgical History:  Procedure Laterality Date  . CESAREAN SECTION N/A 10/29/2013   Procedure: CESAREAN SECTION-Low transverse with vertical T;  Surgeon: Allie Bossier, MD;  Location: [facility];  Service: Obstetrics;  Laterality: N/A;  . CHOLECYSTECTOMY N/A 12/06/2013   Procedure: LAPAROSCOPIC CHOLECYSTECTOMY;  Surgeon: Dalia Heading, MD;  Location: [facility];  Service: General;  Laterality: N/A;  . NO PAST SURGERIES     No Known Allergies No current facility-administered medications on file prior to encounter.   Current Outpatient Medications on File Prior to Encounter  Medication Sig Dispense Refill  . [DISCONTINUED] famotidine (PEPCID) 20 MG tablet Take 1 tablet (20 mg total) by mouth 2 (two) times daily. (Patient not taking: Reported on 08/11/2017) 20 tablet 0   Social History   Socioeconomic History  . Marital status: Single    Spouse name: Not on file  . Number of children: Not on file  . Years of education: Not on file  . Highest education level: Not on file  Occupational History  . Not on file  Tobacco Use  . Smoking status: Never Smoker  . Smokeless tobacco: Never Used  Substance and Sexual Activity  . Alcohol use: No  . Drug use: No  . Sexual activity: Yes    Birth control/protection: Implant  Other Topics Concern  . Not on file  Social History  Narrative  . Not on file   Social Determinants of Health   Financial Resource Strain:   . Difficulty of Paying Living Expenses: Not on file  Food Insecurity:   . Worried About Programme researcher, broadcasting/film/video in the Last Year: Not on file  . Ran Out of Food in the Last Year: Not on file  Transportation Needs:   . Lack of Transportation (Medical): Not on file  . Lack of Transportation (Non-Medical): Not on file  Physical Activity:   . Days of Exercise per Week: Not on file  . Minutes of Exercise per Session: Not on file  Stress:   . Feeling of Stress : Not on file  Social Connections:   . Frequency of Communication with Friends and Family: Not on file  . Frequency of Social Gatherings with Friends and Family: Not on file  . Attends Religious Services: Not on file  . Active Member of Clubs or Organizations: Not on file  . Attends Banker Meetings: Not on file  . Marital Status: Not on file  Intimate Partner Violence:   . Fear of Current or Ex-Partner: Not on file  . Emotionally Abused: Not on file  . Physically Abused: Not on file  . Sexually Abused: Not on file   Family History  Problem Relation Age of Onset  . Heart disease Mother   . Arthritis Mother   . Asthma Mother   .  Heart disease Maternal Grandmother   . Diabetes Maternal Grandmother     OBJECTIVE:  Vitals:   03/22/20 1601  BP: 111/78  Pulse: 71  Resp: 19  Temp: 97.8 F (36.6 C)  TempSrc: Oral  SpO2: 97%  Weight: (!) 341 lb (154.7 kg)    General appearance: alert; appears mildly fatigued, but nontoxic; speaking in full sentences and tolerating own secretions HEENT: NCAT; Ears: EACs clear, TMs pearly gray; Eyes: PERRL.  EOM grossly intact. Nose: nares patent without rhinorrhea, Throat: oropharynx clear, tonsils non erythematous or enlarged, uvula midline  Neck: supple without LAD Lungs: unlabored respirations, symmetrical air entry; cough: mild; no respiratory distress; CTAB Heart: regular rate and  rhythm.  Skin: warm and dry Psychological: alert and cooperative; normal mood and affect  ASSESSMENT & PLAN:  1. Cough     Meds ordered this encounter  Medications  . benzonatate (TESSALON) 100 MG capsule    Sig: Take 1 capsule (100 mg total) by mouth every 8 (eight) hours.    Dispense:  21 capsule    Refill:  0    Order Specific Question:   Supervising Provider    Answer:   Eustace Moore [[id number]]    COVID testing ordered.  It will take between 5-7 days for test results.  Someone will contact you regarding abnormal results.    In the meantime: You should remain isolated in your home for 10 days from symptom onset AND greater than 72 hours after symptoms resolution (absence of fever without the use of fever-reducing medication and improvement in respiratory symptoms), whichever is longer Get plenty of rest and push fluids Tessalon Perles prescribed for cough Use OTC zyrtec for nasal congestion, runny nose, and/or sore throat Use OTC flonase for nasal congestion and runny nose Use medications daily for symptom relief Use OTC medications like ibuprofen or tylenol as needed fever or pain Call or go to the ED if you have any new or worsening symptoms such as fever, worsening cough, shortness of breath, chest tightness, chest pain, turning blue, changes in mental status, etc...   Reviewed expectations re: course of current medical issues. Questions answered. Outlined signs and symptoms indicating need for more acute intervention. Patient verbalized understanding. After Visit Summary given.         Rennis Harding, PA-C 03/22/20 1614

## 2020-03-22 NOTE — ED Triage Notes (Signed)
Cough, nasal congestion and chest congestion since Monday morning.

## 2020-03-22 NOTE — Discharge Instructions (Signed)

## 2020-03-24 LAB — SARS-COV-2, NAA 2 DAY TAT

## 2020-03-24 LAB — NOVEL CORONAVIRUS, NAA: SARS-CoV-2, NAA: NOT DETECTED

## 2020-08-12 ENCOUNTER — Encounter: Payer: Self-pay | Admitting: Women's Health

## 2020-08-12 ENCOUNTER — Ambulatory Visit (INDEPENDENT_AMBULATORY_CARE_PROVIDER_SITE_OTHER): Payer: BC Managed Care – PPO | Admitting: Women's Health

## 2020-08-12 ENCOUNTER — Other Ambulatory Visit: Payer: Self-pay

## 2020-08-12 VITALS — BP 108/80 | HR 71 | Ht 61.0 in | Wt 339.5 lb

## 2020-08-12 DIAGNOSIS — Z3202 Encounter for pregnancy test, result negative: Secondary | ICD-10-CM

## 2020-08-12 DIAGNOSIS — Z30017 Encounter for initial prescription of implantable subdermal contraceptive: Secondary | ICD-10-CM | POA: Diagnosis not present

## 2020-08-12 DIAGNOSIS — Z3046 Encounter for surveillance of implantable subdermal contraceptive: Secondary | ICD-10-CM | POA: Diagnosis not present

## 2020-08-12 LAB — POCT URINE PREGNANCY: Preg Test, Ur: NEGATIVE

## 2020-08-12 MED ORDER — ETONOGESTREL 68 MG ~~LOC~~ IMPL
68.0000 mg | DRUG_IMPLANT | Freq: Once | SUBCUTANEOUS | Status: AC
  Administered 2020-08-12: 68 mg via SUBCUTANEOUS

## 2020-08-12 NOTE — Patient Instructions (Signed)
Keep the area clean and dry.  You can remove the big bandage in 24 hours, and the small steri-strip bandage in 3-5 days.  A back up method, such as condoms, should be used for two weeks. You may have irregular vaginal bleeding for the first 6 months after the Nexplanon is placed, then the bleeding usually lightens and it is possible that you may not have any periods.  If you have any concerns, please give us a call.    Etonogestrel implant What is this medicine? ETONOGESTREL (et oh noe JES trel) is a contraceptive (birth control) device. It is used to prevent pregnancy. It can be used for up to 3 years. This medicine may be used for other purposes; ask your health care provider or pharmacist if you have questions. COMMON BRAND NAME(S): Implanon, Nexplanon What should I tell my health care provider before I take this medicine? They need to know if you have any of these conditions:  abnormal vaginal bleeding  blood vessel disease or blood clots  breast, cervical, endometrial, ovarian, liver, or uterine cancer  diabetes  gallbladder disease  heart disease or recent heart attack  high blood pressure  high cholesterol or triglycerides  kidney disease  liver disease  migraine headaches  seizures  stroke  tobacco smoker  an unusual or allergic reaction to etonogestrel, anesthetics or antiseptics, other medicines, foods, dyes, or preservatives  pregnant or trying to get pregnant  breast-feeding How should I use this medicine? This device is inserted just under the skin on the inner side of your upper arm by a health care professional. Talk to your pediatrician regarding the use of this medicine in children. Special care may be needed. Overdosage: If you think you have taken too much of this medicine contact a poison control center or emergency room at once. NOTE: This medicine is only for you. Do not share this medicine with others. What if I miss a dose? This does not  apply. What may interact with this medicine? Do not take this medicine with any of the following medications:  amprenavir  fosamprenavir This medicine may also interact with the following medications:  acitretin  aprepitant  armodafinil  bexarotene  bosentan  carbamazepine  certain medicines for fungal infections like fluconazole, ketoconazole, itraconazole and voriconazole  certain medicines to treat hepatitis, HIV or AIDS  cyclosporine  felbamate  griseofulvin  lamotrigine  modafinil  oxcarbazepine  phenobarbital  phenytoin  primidone  rifabutin  rifampin  rifapentine  St. John's wort  topiramate This list may not describe all possible interactions. Give your health care provider a list of all the medicines, herbs, non-prescription drugs, or dietary supplements you use. Also tell them if you smoke, drink alcohol, or use illegal drugs. Some items may interact with your medicine. What should I watch for while using this medicine? This product does not protect you against HIV infection (AIDS) or other sexually transmitted diseases. You should be able to feel the implant by pressing your fingertips over the skin where it was inserted. Contact your doctor if you cannot feel the implant, and use a non-hormonal birth control method (such as condoms) until your doctor confirms that the implant is in place. Contact your doctor if you think that the implant may have broken or become bent while in your arm. You will receive a user card from your health care provider after the implant is inserted. The card is a record of the location of the implant in your upper arm   and when it should be removed. Keep this card with your health records. What side effects may I notice from receiving this medicine? Side effects that you should report to your doctor or health care professional as soon as possible:  allergic reactions like skin rash, itching or hives, swelling of the  face, lips, or tongue  breast lumps, breast tissue changes, or discharge  breathing problems  changes in emotions or moods  coughing up blood  if you feel that the implant may have broken or bent while in your arm  high blood pressure  pain, irritation, swelling, or bruising at the insertion site  scar at site of insertion  signs of infection at the insertion site such as fever, and skin redness, pain or discharge  signs and symptoms of a blood clot such as breathing problems; changes in vision; chest pain; severe, sudden headache; pain, swelling, warmth in the leg; trouble speaking; sudden numbness or weakness of the face, arm or leg  signs and symptoms of liver injury like dark yellow or brown urine; general ill feeling or flu-like symptoms; light-colored stools; loss of appetite; nausea; right upper belly pain; unusually weak or tired; yellowing of the eyes or skin  unusual vaginal bleeding, discharge Side effects that usually do not require medical attention (report to your doctor or health care professional if they continue or are bothersome):  acne  breast pain or tenderness  headache  irregular menstrual bleeding  nausea This list may not describe all possible side effects. Call your doctor for medical advice about side effects. You may report side effects to FDA at 1-800-FDA-1088. Where should I keep my medicine? This drug is given in a hospital or clinic and will not be stored at home. NOTE: This sheet is a summary. It may not cover all possible information. If you have questions about this medicine, talk to your doctor, pharmacist, or health care provider.  2021 Elsevier/Gold Standard (2019-02-07 11:33:04)

## 2020-08-12 NOTE — Progress Notes (Signed)
   NEXPLANON REMOVAL AND RE-INSERTION Patient name: Tasha Murray MRN [id number]  Date of birth: 1991-04-23 Subjective Findings:   Tasha Murray is a 30 y.o. G49P1001 African American female being seen today for Nexplanon removal and re-insertion. Her Nexplanon was placed 08/11/17.   Patient's last menstrual period was 08/08/2020. Last pap 2015. Results were: LSIL w/ HRHPV not done  Risks/benefits/side effects of Nexplanon have been discussed and her questions have been answered.  Specifically, a failure rate of 05/998 has been reported, with an increased failure rate if pt takes St. John's Wort and/or antiseizure medicaitons.  She is aware of the common side effect of irregular bleeding, which the incidence of decreases over time. Signed copy of informed consent in chart.  No flowsheet data found.  Pertinent History Reviewed:   Reviewed past medical,surgical, social, obstetrical and family history.  Reviewed problem list, medications and allergies. Objective Findings & Procedure:    Vitals:   08/12/20 1559  BP: 108/80  Pulse: 71  Weight: (!) 339 lb 8 oz (154 kg)  Height: 5\' 1"  (1.549 m)  Body mass index is 64.15 kg/m.  Results for orders placed or performed in visit on 08/12/20 (from the past 24 hour(s))  POCT urine pregnancy   Collection Time: 08/12/20  4:15 PM  Result Value Ref Range   Preg Test, Ur Negative Negative     Time out was performed.  Nexplanon site identified.  Area prepped in usual sterile fashon. Two cc's of 2% lidocaine was used to anesthetize the area. A small stab incision was made right beside the implant on the distal portion.  The Nexplanon rod was grasped using hemostats and removed intact without difficulty.  The area was cleansed again with betadine and the Nexplanon was inserted approximately 10cm from the medial epicondyle and 3-5cm posterior to the sulcus per manufacturer's recommendations without difficulty.  Steri-strips and a pressure bandage was  applied.  There was less than 3 cc blood loss. There were no complications.  The patient tolerated the procedure well. Assessment & Plan:   1) Nexplanon removal & re-insertion She was instructed to keep the area clean and dry, remove pressure bandage in 24 hours, and keep insertion site covered with the steri-strips for 3-5 days.  She was given a card indicating date Nexplanon was inserted and date it needs to be removed.  Follow-up PRN problems.  2) H/O abnormal pap> past due, schedule today  Orders Placed This Encounter  Procedures  . POCT urine pregnancy    Follow-up: Return for 1st available, Pap & physical.  10/12/20 CNM, [facility] 08/12/2020 4:37 PM

## 2020-08-12 NOTE — Addendum Note (Signed)
Addended by: Colen Darling on: 08/12/2020 04:42 PM   Modules accepted: Orders

## 2020-09-11 ENCOUNTER — Encounter: Payer: Self-pay | Admitting: Advanced Practice Midwife

## 2020-09-11 ENCOUNTER — Ambulatory Visit (INDEPENDENT_AMBULATORY_CARE_PROVIDER_SITE_OTHER): Payer: BC Managed Care – PPO | Admitting: Advanced Practice Midwife

## 2020-09-11 ENCOUNTER — Other Ambulatory Visit (HOSPITAL_COMMUNITY)
Admission: RE | Admit: 2020-09-11 | Discharge: 2020-09-11 | Disposition: A | Discharge status: Home / Self Care | Payer: BC Managed Care – PPO | Source: Ambulatory Visit | Attending: Advanced Practice Midwife | Admitting: Advanced Practice Midwife

## 2020-09-11 VITALS — BP 118/76 | HR 89 | Ht 61.0 in | Wt 340.0 lb

## 2020-09-11 DIAGNOSIS — Z01419 Encounter for gynecological examination (general) (routine) without abnormal findings: Secondary | ICD-10-CM

## 2020-09-11 NOTE — Progress Notes (Signed)
   WELL-WOMAN EXAMINATION Patient name: Tasha Murray MRN [id number]  Date of birth: November 03, 1990 Chief Complaint:   Gynecologic Exam  History of Present Illness:   Tasha Murray is a 30 y.o. G81P1001 African American female being seen today for a routine well-woman exam.  Current complaints: doing well; had Nexplanon replaced 08/12/2020  Depression screen PHQ 2/9 09/11/2020  Decreased Interest 0  Down, Depressed, Hopeless 0  PHQ - 2 Score 0  Altered sleeping 0  Tired, decreased energy 0  Change in appetite 0  Feeling bad or failure about yourself  0  Trouble concentrating 0  Moving slowly or fidgety/restless 0  Suicidal thoughts 0  PHQ-9 Score 0     PCP: none      does not desire labs No LMP recorded (lmp unknown). Patient has had an implant. The current method of family planning is Nexplanon.  Last pap 2015. Results were: LSIL w/ HRHPV not done. H/O abnormal pap: yes Last mammogram: never. Family h/o breast cancer: no Last colonoscopy: never. Family h/o colorectal cancer: no Review of Systems:   Pertinent items are noted in HPI Denies any headaches, blurred vision, fatigue, shortness of breath, chest pain, abdominal pain, abnormal vaginal discharge/itching/odor/irritation, problems with periods, bowel movements, urination, or intercourse unless otherwise stated above. Pertinent History Reviewed:  Reviewed past medical,surgical, social and family history.  Reviewed problem list, medications and allergies. Physical Assessment:   Vitals:   09/11/20 1551  BP: 118/76  Pulse: 89  Weight: (!) 340 lb (154.2 kg)  Height: 5\' 1"  (1.549 m)  Body mass index is 64.24 kg/m.        Physical Examination: (exam limited by habitus)  General appearance - well appearing, and in no distress  Mental status - alert, oriented to person, place, and time  Psych:  She has a normal mood and affect  Skin - warm and dry, normal color, no suspicious lesions noted  Chest - effort normal, all lung  fields clear to auscultation bilaterally  Heart - normal rate and regular rhythm  Neck:  midline trachea, no thyromegaly or nodules  Breasts - breasts appear normal, no suspicious masses, no skin or nipple changes or  axillary nodes  Abdomen - soft, nontender, nondistended, no masses or organomegaly  Pelvic - VULVA: normal appearing vulva with no masses, tenderness or lesions  VAGINA: normal appearing vagina with normal color and discharge, no lesions  CERVIX: normal appearing cervix without discharge or lesions, no CMT  Thin prep pap is done with HR HPV cotesting  UTERUS: uterus is felt to be normal size, shape, consistency and nontender   ADNEXA: No adnexal masses or tenderness noted.  Extremities:  No swelling or varicosities noted  Chaperone: Angel Neas    No results found for this or any previous visit (from the past 24 hour(s)).  Assessment & Plan:  1) Well-Woman Exam  2) Morbid obesity, exam limited by habitus  3) Hx Pap with LSIL in 2015, no f/u  Labs/procedures today: Pap/GC/chlam  Mammogram: @ 30yo, or sooner if problems Colonoscopy: @ 30yo, or sooner if problems  No orders of the defined types were placed in this encounter.   Meds: No orders of the defined types were placed in this encounter.   Follow-up: Return in about 1 year (around 09/11/2021) for Physical.  11/11/2021 CNM 09/11/2020 4:20 PM

## 2020-09-16 LAB — CYTOLOGY - PAP
Chlamydia: NEGATIVE
Comment: NEGATIVE
Comment: NEGATIVE
Comment: NORMAL
Diagnosis: NEGATIVE
High risk HPV: NEGATIVE
Neisseria Gonorrhea: NEGATIVE

## 2021-01-17 ENCOUNTER — Ambulatory Visit
Admission: EM | Admit: 2021-01-17 | Discharge: 2021-01-17 | Disposition: A | Discharge status: Home / Self Care | Payer: BC Managed Care – PPO | Attending: Emergency Medicine | Admitting: Emergency Medicine

## 2021-01-17 ENCOUNTER — Other Ambulatory Visit: Payer: Self-pay

## 2021-01-17 DIAGNOSIS — R0981 Nasal congestion: Secondary | ICD-10-CM

## 2021-01-17 MED ORDER — AMOXICILLIN-POT CLAVULANATE 875-125 MG PO TABS: 1.0000 | ORAL_TABLET | Freq: Two times a day (BID) | ORAL | 0 refills | Status: AC

## 2021-01-17 NOTE — ED Triage Notes (Signed)
Started 29th of august, nasal congestion, cough.  No fevers. No OTC medication used.

## 2021-01-17 NOTE — Discharge Instructions (Signed)
Get plenty of rest and push fluids Augmentin prescribed for possible sinus infection Use OTC zyrtec for nasal congestion, runny nose, and/or sore throat Use OTC flonase for nasal congestion and runny nose Use medications daily for symptom relief Use OTC medications like ibuprofen or tylenol as needed fever or pain Call or go to the ED if you have any new or worsening symptoms such as fever, worsening cough, shortness of breath, chest tightness, chest pain, turning blue, changes in mental status, etc...  

## 2021-01-17 NOTE — ED Provider Notes (Signed)
[facility]   [id number] 01/17/21 Arrival Time: 3220   CC: Sinus congestion  SUBJECTIVE: History from: patient.  Tasha Murray is a 30 y.o. female who presents nasal congestion, cough x 11 days.  Denies sick exposure to COVID, flu or strep.  Has NOT tried OTC medications without relief.  Denies aggravating factors. Reports previous symptoms in the past.   Denies fever, chills, SOB, wheezing, chest pain, nausea, changes in bowel or bladder habits.    ROS: As per HPI.  All other pertinent ROS negative.     Past Medical History:  Diagnosis Date   History of gonorrhea    Medical history non-contributory    Past Surgical History:  Procedure Laterality Date   CESAREAN SECTION N/A 10/29/2013   Procedure: CESAREAN SECTION-Low transverse with vertical T;  Surgeon: Allie Bossier, MD;  Location: [facility];  Service: Obstetrics;  Laterality: N/A;   CHOLECYSTECTOMY N/A 12/06/2013   Procedure: LAPAROSCOPIC CHOLECYSTECTOMY;  Surgeon: Dalia Heading, MD;  Location: [facility];  Service: General;  Laterality: N/A;   NO PAST SURGERIES     No Known Allergies No current facility-administered medications on file prior to encounter.   Current Outpatient Medications on File Prior to Encounter  Medication Sig Dispense Refill   etonogestrel (NEXPLANON) 68 MG IMPL implant 1 each by Subdermal route once.     [DISCONTINUED] famotidine (PEPCID) 20 MG tablet Take 1 tablet (20 mg total) by mouth 2 (two) times daily. (Patient not taking: Reported on 08/11/2017) 20 tablet 0   Social History   Socioeconomic History   Marital status: Single    Spouse name: Not on file   Number of children: Not on file   Years of education: Not on file   Highest education level: Not on file  Occupational History   Not on file  Tobacco Use   Smoking status: Never   Smokeless tobacco: Never  Vaping Use   Vaping Use: Never used  Substance and Sexual Activity   Alcohol use: No   Drug use: No   Sexual activity: Not  Currently    Birth control/protection: Implant  Other Topics Concern   Not on file  Social History Narrative   Not on file   Social Determinants of Health   Financial Resource Strain: Low Risk    Difficulty of Paying Living Expenses: Not hard at all  Food Insecurity: No Food Insecurity   Worried About Programme researcher, broadcasting/film/video in the Last Year: Never true   Ran Out of Food in the Last Year: Never true  Transportation Needs: No Transportation Needs   Lack of Transportation (Medical): No   Lack of Transportation (Non-Medical): No  Physical Activity: Unknown   Days of Exercise per Week: Patient refused   Minutes of Exercise per Session: 10 min  Stress: No Stress Concern Present   Feeling of Stress : Not at all  Social Connections: Moderately Isolated   Frequency of Communication with Friends and Family: More than three times a week   Frequency of Social Gatherings with Friends and Family: Once a week   Attends Religious Services: More than 4 times per year   Active Member of [employer] or Organizations: No   Attends Banker Meetings: Never   Marital Status: Never married  Catering manager Violence: Not At Risk   Fear of Current or Ex-Partner: No   Emotionally Abused: No   Physically Abused: No   Sexually Abused: No   Family History  Problem  Relation Age of Onset   Heart disease Mother    Arthritis Mother    Asthma Mother    Heart disease Maternal Grandmother    Diabetes Maternal Grandmother     OBJECTIVE:  Vitals:   01/17/21 0935 01/17/21 0937  BP: 117/88   Pulse: 82   Resp: 16   Temp: 98.3 F (36.8 C)   TempSrc: Oral   SpO2: 97%   Weight:  290 lb (131.5 kg)     General appearance: alert; appears mildly fatigued, but nontoxic; speaking in full sentences and tolerating own secretions; using mobile device to look at facebook during examination HEENT: NCAT; Ears: EACs clear, TMs pearly gray; Eyes: PERRL.  EOM grossly intact. Nose: nares patent with mild clear  rhinorrhea, turbinates swollen and erythematous, Throat: oropharynx clear, tonsils non erythematous or enlarged, uvula midline  Neck: supple without LAD Lungs: unlabored respirations, symmetrical air entry; cough: absent; no respiratory distress; CTAB Heart: regular rate and rhythm.   Skin: warm and dry Psychological: alert and cooperative; normal mood and affect  ASSESSMENT & PLAN:  1. Nasal congestion   2. Sinus congestion     Meds ordered this encounter  Medications   amoxicillin-clavulanate (AUGMENTIN) 875-125 MG tablet    Sig: Take 1 tablet by mouth every 12 (twelve) hours for 10 days.    Dispense:  20 tablet    Refill:  0    Order Specific Question:   Supervising Provider    Answer:   Eustace Moore [[id number]]   Get plenty of rest and push fluids Augmentin prescribed for possible sinus infection Use OTC zyrtec for nasal congestion, runny nose, and/or sore throat Use OTC flonase for nasal congestion and runny nose Use medications daily for symptom relief Use OTC medications like ibuprofen or tylenol as needed fever or pain Call or go to the ED if you have any new or worsening symptoms such as fever, worsening cough, shortness of breath, chest tightness, chest pain, turning blue, changes in mental status, etc...   Reviewed expectations re: course of current medical issues. Questions answered. Outlined signs and symptoms indicating need for more acute intervention. Patient verbalized understanding. After Visit Summary given.          Rennis Harding, PA-C 01/17/21 1006

## 2021-01-18 LAB — COVID-19, FLU A+B NAA
Influenza A, NAA: NOT DETECTED
Influenza B, NAA: NOT DETECTED
SARS-CoV-2, NAA: DETECTED — AB

## 2021-06-16 ENCOUNTER — Other Ambulatory Visit: Payer: Self-pay

## 2021-06-16 ENCOUNTER — Ambulatory Visit
Admission: EM | Admit: 2021-06-16 | Discharge: 2021-06-16 | Disposition: A | Discharge status: Home / Self Care | Payer: BC Managed Care – PPO | Attending: Family Medicine | Admitting: Family Medicine

## 2021-06-16 DIAGNOSIS — R1084 Generalized abdominal pain: Secondary | ICD-10-CM | POA: Diagnosis not present

## 2021-06-16 DIAGNOSIS — R112 Nausea with vomiting, unspecified: Secondary | ICD-10-CM

## 2021-06-16 LAB — POCT URINALYSIS DIP (MANUAL ENTRY)
Bilirubin, UA: NEGATIVE
Glucose, UA: NEGATIVE mg/dL
Leukocytes, UA: NEGATIVE
Nitrite, UA: NEGATIVE
Protein Ur, POC: NEGATIVE mg/dL
Spec Grav, UA: >=1.03 — AB (ref 1.010–1.025)
Urobilinogen, UA: 0.2 E.U./dL
pH, UA: 5.5 (ref 5.0–8.0)

## 2021-06-16 LAB — POCT URINE PREGNANCY: Preg Test, Ur: NEGATIVE

## 2021-06-16 MED ORDER — ONDANSETRON 4 MG PO TBDP: 4.0000 mg | ORAL_TABLET | Freq: Three times a day (TID) | ORAL | 0 refills | Status: DC | PRN

## 2021-06-16 NOTE — ED Provider Notes (Signed)
[facility]    CSN: [id number] Arrival date & time: 06/16/21  0948      History   Chief Complaint Chief Complaint  Patient presents with   Abdominal Pain   Emesis    HPI Tasha Murray is a 31 y.o. female.   Presenting today with 1 day history of generalized abdominal pain, nausea, vomiting since this morning.  Denies fever, chills, cough, upper respiratory symptoms, diarrhea, constipation.  So far not trying anything over-the-counter for symptoms.  No known sick contacts recently.  No new foods or medications, recent travel recently.   Past Medical History:  Diagnosis Date   History of gonorrhea    Medical history non-contributory     Patient Active Problem List   Diagnosis Date Noted   Insertion of Nexplanon 01/02/2014   Cholecystitis with cholelithiasis 12/04/2013   Severe obesity (BMI >= 40) (HCC) 05/31/2013    Past Surgical History:  Procedure Laterality Date   CESAREAN SECTION N/A 10/29/2013   Procedure: CESAREAN SECTION-Low transverse with vertical T;  Surgeon: Allie Bossier, MD;  Location: [facility];  Service: Obstetrics;  Laterality: N/A;   CHOLECYSTECTOMY N/A 12/06/2013   Procedure: LAPAROSCOPIC CHOLECYSTECTOMY;  Surgeon: Dalia Heading, MD;  Location: [facility];  Service: General;  Laterality: N/A;   NO PAST SURGERIES      OB History     Gravida  1   Para  1   Term  1   Preterm      AB      Living  1      SAB      IAB      Ectopic      Multiple      Live Births  1            Home Medications    Prior to Admission medications   Medication Sig Start Date End Date Taking? Authorizing Provider  ondansetron (ZOFRAN-ODT) 4 MG disintegrating tablet Take 1 tablet (4 mg total) by mouth every 8 (eight) hours as needed for nausea or vomiting. 06/16/21  Yes Particia Nearing, PA-C  etonogestrel (NEXPLANON) 68 MG IMPL implant 1 each by Subdermal route once.    [provider]  famotidine (PEPCID) 20 MG tablet Take 1 tablet  (20 mg total) by mouth 2 (two) times daily. Patient not taking: Reported on 08/11/2017 03/10/16 04/19/19  Devoria Albe, MD    Family History Family History  Problem Relation Age of Onset   Heart disease Mother    Arthritis Mother    Asthma Mother    Heart disease Maternal Grandmother    Diabetes Maternal Grandmother     Social History Social History   Tobacco Use   Smoking status: Never   Smokeless tobacco: Never  Vaping Use   Vaping Use: Never used  Substance Use Topics   Alcohol use: No   Drug use: Never     Allergies   Patient has no known allergies.   Review of Systems Review of Systems Per HPI  Physical Exam Triage Vital Signs ED Triage Vitals  Enc Vitals Group     BP 06/16/21 1051 135/83     Pulse Rate 06/16/21 1051 64     Resp 06/16/21 1051 18     Temp 06/16/21 1051 98.1 F (36.7 C)     Temp Source 06/16/21 1051 Oral     SpO2 06/16/21 1051 98 %     Weight --      Height --  Head Circumference --      Peak Flow --      Pain Score 06/16/21 1050 10     Pain Loc --      Pain Edu? --      Excl. in GC? --    No data found.  Updated Vital Signs BP 135/83 (BP Location: Right Wrist)    Pulse 64    Temp 98.1 F (36.7 C) (Oral)    Resp 18    SpO2 98%   Visual Acuity Right Eye Distance:   Left Eye Distance:   Bilateral Distance:    Right Eye Near:   Left Eye Near:    Bilateral Near:     Physical Exam Vitals and nursing note reviewed.  Constitutional:      Appearance: Normal appearance. She is not ill-appearing.  HENT:     Head: Atraumatic.     Mouth/Throat:     Mouth: Mucous membranes are moist.  Eyes:     Extraocular Movements: Extraocular movements intact.     Conjunctiva/sclera: Conjunctivae normal.  Cardiovascular:     Rate and Rhythm: Normal rate and regular rhythm.     Heart sounds: Normal heart sounds.  Pulmonary:     Effort: Pulmonary effort is normal.     Breath sounds: Normal breath sounds.  Abdominal:     General: Bowel  sounds are normal. There is no distension.     Palpations: Abdomen is soft.     Tenderness: There is abdominal tenderness. There is no right CVA tenderness, left CVA tenderness or guarding.     Comments: Mild diffuse generalized tenderness to palpation without distention or guarding  Musculoskeletal:        General: Normal range of motion.     Cervical back: Normal range of motion and neck supple.  Skin:    General: Skin is warm and dry.  Neurological:     Mental Status: She is alert and oriented to person, place, and time.  Psychiatric:        Mood and Affect: Mood normal.        Thought Content: Thought content normal.        Judgment: Judgment normal.     UC Treatments / Results  Labs (all labs ordered are listed, but only abnormal results are displayed) Labs Reviewed  POCT URINALYSIS DIP (MANUAL ENTRY) - Abnormal; Notable for the following components:      Result Value   Ketones, POC UA large (80) (*)    Spec Grav, UA >=1.030 (*)    Blood, UA large (*)    All other components within normal limits  POCT URINE PREGNANCY    EKG   Radiology No results found.  Procedures Procedures (including critical care time)  Medications Ordered in UC Medications - No data to display  Initial Impression / Assessment and Plan / UC Course  I have reviewed the triage vital signs and the nursing notes.  Pertinent labs & imaging results that were available during my care of the patient were reviewed by me and considered in my medical decision making (see chart for details).     Suspect viral GI illness, urinalysis without evidence of urinary tract infection, urine pregnancy negative, exam and vital signs reassuring.  Will treat with Zofran, brat diet, fluids.  Work note given.  Return for acutely worsening symptoms.  Final Clinical Impressions(s) / UC Diagnoses   Final diagnoses:  Nausea and vomiting, unspecified vomiting type  Generalized abdominal pain  Discharge  Instructions   None    ED Prescriptions     Medication Sig Dispense Auth. Provider   ondansetron (ZOFRAN-ODT) 4 MG disintegrating tablet Take 1 tablet (4 mg total) by mouth every 8 (eight) hours as needed for nausea or vomiting. 20 tablet Particia Nearing, [place]      PDMP not reviewed this encounter.   Particia Nearing, [place] 06/16/21 1114

## 2021-06-16 NOTE — ED Triage Notes (Signed)
Pt reports lower abdominal pain and states she vomited 3 times this morning "yellow stuff".

## 2021-09-24 ENCOUNTER — Ambulatory Visit (INDEPENDENT_AMBULATORY_CARE_PROVIDER_SITE_OTHER): Payer: BC Managed Care – PPO | Admitting: Advanced Practice Midwife

## 2021-09-24 ENCOUNTER — Encounter: Payer: Self-pay | Admitting: Advanced Practice Midwife

## 2021-09-24 VITALS — BP 106/70 | HR 78 | Ht 61.0 in | Wt 282.4 lb

## 2021-09-24 DIAGNOSIS — L680 Hirsutism: Secondary | ICD-10-CM | POA: Diagnosis not present

## 2021-09-24 DIAGNOSIS — Z01419 Encounter for gynecological examination (general) (routine) without abnormal findings: Secondary | ICD-10-CM

## 2021-09-24 NOTE — Progress Notes (Addendum)
WELL-WOMAN EXAMINATION Patient name: Tasha Murray MRN [id number]  Date of birth: [birth year] Chief Complaint:   Gynecologic Exam  History of Present Illness:   Tasha Murray is a 31 y.o. G36P1001 African-American female being seen today for a routine well-woman exam.  Current complaints: none; please with weight loss (340lb 5/22 to 282lb today)- states it's from having a more active job; not having cycles; Nexplanon placed 08/12/20  PCP: none      does not desire labs No LMP recorded. Patient has had an implant. The current method of family planning is  Nexplanon (currently abstinent) .  Last pap May 2022. Results were: NILM w/ HRHPV negative. H/O abnormal pap: yes Last mammogram: never. Results were: N/A. Family h/o breast cancer: no Last colonoscopy: never. Results were: N/A. Family h/o colorectal cancer: no     09/24/2021    2:50 PM 09/11/2020    3:47 PM  Depression screen PHQ 2/9  Decreased Interest 0 0  Down, Depressed, Hopeless 0 0  PHQ - 2 Score 0 0  Altered sleeping 0 0  Tired, decreased energy 0 0  Change in appetite 0 0  Feeling bad or failure about yourself  0 0  Trouble concentrating 0 0  Moving slowly or fidgety/restless 0 0  Suicidal thoughts 0 0  PHQ-9 Score 0 0        09/24/2021    2:50 PM 09/11/2020    3:48 PM  GAD 7 : Generalized Anxiety Score  Nervous, Anxious, on Edge 0 0  Control/stop worrying 0 0  Worry too much - different things 0 0  Trouble relaxing 0 0  Restless 0 0  Easily annoyed or irritable 0 0  Afraid - awful might happen 0 0  Total GAD 7 Score 0 0     Review of Systems:   Pertinent items are noted in HPI Denies any headaches, blurred vision, fatigue, shortness of breath, chest pain, abdominal pain, abnormal vaginal discharge/itching/odor/irritation, problems with periods, bowel movements, urination, or intercourse unless otherwise stated above. Pertinent History Reviewed:  Reviewed past medical,surgical, social and family history.   Reviewed problem list, medications and allergies. Physical Assessment:   Vitals:   09/24/21 1446  BP: 106/70  Pulse: 78  Weight: 282 lb 6.4 oz (128.1 kg)  Height: 5\' 1"  (1.549 m)  Body mass index is 53.36 kg/m.        Physical Examination:   General appearance - well appearing, and in no distress  Mental status - alert, oriented to person, place, and time  Psych:  She has a normal mood and affect  Skin - warm and dry, normal color, no suspicious lesions noted  Face - hirsutism noted above upper lip (neck not evaluated due to gaiter)  Chest - effort normal, all lung fields clear to auscultation bilaterally  Heart - normal rate and regular rhythm  Neck:  midline trachea, no thyromegaly or nodules  Breasts - breasts appear normal, no suspicious masses, no skin or nipple changes or  axillary nodes  Abdomen - soft, nontender, nondistended, no masses or organomegaly  Pelvic - VULVA: normal appearing vulva with no masses, tenderness or lesions  VAGINA: normal appearing vagina with normal color and discharge, no lesions  CERVIX: normal appearing cervix without discharge or lesions, no CMT  Thin prep pap is not done   UTERUS: uterus is felt to be normal size, shape, consistency and nontender, although difficult to palpate given habitus  ADNEXA: No adnexal masses or tenderness  noted.  Extremities:  No swelling or varicosities noted  Chaperone: Faith Rogue    No results found for this or any previous visit (from the past 24 hour(s)).  Assessment & Plan:  1) Well-Woman Exam  2) Morbid obesity, exam limited by habitus  3) No PCP, recommend establish care  Labs/procedures today: none  Mammogram: @ 31yo, or sooner if problems Colonoscopy: @ 31yo, or sooner if problems  No orders of the defined types were placed in this encounter.   Meds: No orders of the defined types were placed in this encounter.   Follow-up: Return in about 1 year (around 09/25/2022) for  physical.  Arabella [facility] 09/24/2021 3:29 PM

## 2021-09-25 DIAGNOSIS — L680 Hirsutism: Secondary | ICD-10-CM | POA: Insufficient documentation

## 2022-09-30 ENCOUNTER — Encounter: Payer: Self-pay | Admitting: Adult Health

## 2022-09-30 ENCOUNTER — Ambulatory Visit (INDEPENDENT_AMBULATORY_CARE_PROVIDER_SITE_OTHER): Payer: BC Managed Care – PPO | Admitting: Adult Health

## 2022-09-30 VITALS — BP 116/84 | HR 77 | Ht 60.0 in | Wt 280.0 lb

## 2022-09-30 DIAGNOSIS — Z975 Presence of (intrauterine) contraceptive device: Secondary | ICD-10-CM | POA: Diagnosis not present

## 2022-09-30 DIAGNOSIS — Z01419 Encounter for gynecological examination (general) (routine) without abnormal findings: Secondary | ICD-10-CM

## 2022-09-30 NOTE — Progress Notes (Signed)
Patient ID: Tasha Murray, female   DOB: February 28, 1991, 32 y.o.   MRN: [id number] History of Present Illness: Tasha Murray is a 32 year old black female,single, G1P1 in for a well woman gyn exam.  Last pap was negative HPV,NILM 09/11/20   Current Medications, Allergies, Past Medical History, Past Surgical History, Family History and Social History were reviewed in [employer] record.     Review of Systems: Patient denies any headaches, hearing loss, fatigue, blurred vision, shortness of breath, chest pain, abdominal pain, problems with bowel movements, urination, or intercourse.(Not active).  No joint pain or Murray swings.     Physical Exam:BP 116/84 (BP Location: Left Arm, Patient Position: Sitting, Cuff Size: Large)   Pulse 77   Ht 5' (1.524 m)   Wt 280 lb (127 kg)   BMI 54.68 kg/m   General:  Well developed, well nourished, no acute distress Skin:  Warm and dry Neck:  Midline trachea, normal thyroid, good ROM, no lymphadenopathy Lungs; Clear to auscultation bilaterally Breast:  No dominant palpable mass, retraction, or nipple discharge Cardiovascular: Regular rate and rhythm Abdomen:  Soft, non tender, no hepatosplenomegaly Pelvic:  External genitalia is normal in appearance, no lesions.  The vagina is normal in appearance. Urethra has no lesions or masses. The cervix is smooth.  Uterus is felt to be normal size, shape, and contour.  No adnexal masses or tenderness noted.Bladder is non tender, no masses felt. Extremities/musculoskeletal:  No swelling or varicosities noted, no clubbing or cyanosis Psych:  No Murray changes, alert and cooperative,seems happy AA is 0 Fall risk is low    09/30/2022    3:51 PM 09/24/2021    2:50 PM 09/11/2020    3:47 PM  Depression screen PHQ 2/9  Decreased Interest 0 0 0  Down, Depressed, Hopeless 0 0 0  PHQ - 2 Score 0 0 0  Altered sleeping 0 0 0  Tired, decreased energy 0 0 0  Change in appetite 0 0 0  Feeling bad or failure about  yourself  0 0 0  Trouble concentrating 0 0 0  Moving slowly or fidgety/restless 0 0 0  Suicidal thoughts 0 0 0  PHQ-9 Score 0 0 0       09/30/2022    3:51 PM 09/24/2021    2:50 PM 09/11/2020    3:48 PM  GAD 7 : Generalized Anxiety Score  Nervous, Anxious, on Edge 0 0 0  Control/stop worrying 0 0 0  Worry too much - different things 0 0 0  Trouble relaxing 0 0 0  Restless 0 0 0  Easily annoyed or irritable 0 0 0  Afraid - awful might happen 0 0 0  Total GAD 7 Score 0 0 0    Upstream - 09/30/22 1553       Pregnancy Intention Screening   Does the patient want to become pregnant in the next year? No    Does the patient's partner want to become pregnant in the next year? No    Would the patient like to discuss contraceptive options today? No      Contraception Wrap Up   Current Method Abstinence;Hormonal Implant    End Method Abstinence;Hormonal Implant            Examination chaperoned by Tasha Mood LPN    Impression and Plan: 1. Encounter for well woman exam with routine gynecological exam Pap and physical in 1 year  2. Nexplanon in place Placed 08/12/20 Return 08/10/23 for  removal and reinsertion

## 2023-08-06 ENCOUNTER — Encounter: Payer: Self-pay | Admitting: Women's Health

## 2023-09-01 ENCOUNTER — Encounter: Payer: Self-pay | Admitting: Women's Health

## 2023-09-03 ENCOUNTER — Encounter: Payer: Self-pay | Admitting: Obstetrics and Gynecology
